# Patient Record
Sex: Male | Born: 1970 | Race: White | Hispanic: No | Marital: Married | Smoking: Never smoker
Health system: Southern US, Community
[De-identification: ages and names within clinical notes are randomized; demographics above are authoritative.]

## PROBLEM LIST (undated history)

## (undated) ENCOUNTER — Emergency Department (HOSPITAL_COMMUNITY): Admission: EM | Payer: Self-pay

## (undated) DIAGNOSIS — M2170 Unequal limb length (acquired), unspecified site: Secondary | ICD-10-CM

## (undated) DIAGNOSIS — F418 Other specified anxiety disorders: Secondary | ICD-10-CM

## (undated) DIAGNOSIS — D126 Benign neoplasm of colon, unspecified: Secondary | ICD-10-CM

## (undated) DIAGNOSIS — K219 Gastro-esophageal reflux disease without esophagitis: Secondary | ICD-10-CM

## (undated) DIAGNOSIS — I861 Scrotal varices: Secondary | ICD-10-CM

## (undated) DIAGNOSIS — M542 Cervicalgia: Secondary | ICD-10-CM

## (undated) DIAGNOSIS — M549 Dorsalgia, unspecified: Secondary | ICD-10-CM

## (undated) DIAGNOSIS — J209 Acute bronchitis, unspecified: Secondary | ICD-10-CM

## (undated) DIAGNOSIS — R0683 Snoring: Secondary | ICD-10-CM

## (undated) DIAGNOSIS — G4719 Other hypersomnia: Secondary | ICD-10-CM

## (undated) DIAGNOSIS — M5020 Other cervical disc displacement, unspecified cervical region: Secondary | ICD-10-CM

## (undated) DIAGNOSIS — D1803 Hemangioma of intra-abdominal structures: Secondary | ICD-10-CM

## (undated) DIAGNOSIS — J309 Allergic rhinitis, unspecified: Secondary | ICD-10-CM

## (undated) DIAGNOSIS — R202 Paresthesia of skin: Secondary | ICD-10-CM

## (undated) DIAGNOSIS — D734 Cyst of spleen: Secondary | ICD-10-CM

## (undated) HISTORY — PX: WISDOM TOOTH EXTRACTION: SHX21

---
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
## 1998-05-12 ENCOUNTER — Emergency Department (HOSPITAL_COMMUNITY): Admission: EM | Admit: 1998-05-12 | Discharge: 1998-05-12 | Payer: Self-pay | Admitting: Emergency Medicine

## 1998-05-12 ENCOUNTER — Encounter: Payer: Self-pay | Admitting: Emergency Medicine

## 1998-11-07 ENCOUNTER — Other Ambulatory Visit: Admission: RE | Admit: 1998-11-07 | Discharge: 1998-11-07 | Payer: Self-pay | Admitting: Gastroenterology

## 1998-12-19 ENCOUNTER — Emergency Department (HOSPITAL_COMMUNITY): Admission: EM | Admit: 1998-12-19 | Discharge: 1998-12-19 | Payer: Self-pay | Admitting: Emergency Medicine

## 1999-04-19 ENCOUNTER — Emergency Department (HOSPITAL_COMMUNITY): Admission: EM | Admit: 1999-04-19 | Discharge: 1999-04-19 | Payer: Self-pay | Admitting: Emergency Medicine

## 2001-11-01 ENCOUNTER — Encounter (INDEPENDENT_AMBULATORY_CARE_PROVIDER_SITE_OTHER): Payer: Self-pay | Admitting: Specialist

## 2001-11-01 ENCOUNTER — Ambulatory Visit (HOSPITAL_BASED_OUTPATIENT_CLINIC_OR_DEPARTMENT_OTHER): Admission: RE | Admit: 2001-11-01 | Discharge: 2001-11-01 | Payer: Self-pay | Admitting: Urology

## 2001-12-07 ENCOUNTER — Emergency Department (HOSPITAL_COMMUNITY): Admission: EM | Admit: 2001-12-07 | Discharge: 2001-12-08 | Payer: Self-pay | Admitting: Emergency Medicine

## 2001-12-08 ENCOUNTER — Encounter: Payer: Self-pay | Admitting: Emergency Medicine

## 2003-01-21 ENCOUNTER — Emergency Department (HOSPITAL_COMMUNITY): Admission: EM | Admit: 2003-01-21 | Discharge: 2003-01-21 | Payer: Self-pay | Admitting: Emergency Medicine

## 2003-06-21 ENCOUNTER — Emergency Department (HOSPITAL_COMMUNITY): Admission: EM | Admit: 2003-06-21 | Discharge: 2003-06-21 | Payer: Self-pay | Admitting: Emergency Medicine

## 2004-07-06 ENCOUNTER — Emergency Department (HOSPITAL_COMMUNITY): Admission: EM | Admit: 2004-07-06 | Discharge: 2004-07-07 | Payer: Self-pay | Admitting: Emergency Medicine

## 2007-07-30 ENCOUNTER — Emergency Department (HOSPITAL_COMMUNITY): Admission: EM | Admit: 2007-07-30 | Discharge: 2007-07-30 | Payer: Self-pay | Admitting: Emergency Medicine

## 2007-09-15 ENCOUNTER — Encounter: Admission: RE | Admit: 2007-09-15 | Discharge: 2007-09-15 | Payer: Self-pay | Admitting: Emergency Medicine

## 2007-12-14 ENCOUNTER — Encounter: Admission: RE | Admit: 2007-12-14 | Discharge: 2007-12-14 | Payer: Self-pay | Admitting: Emergency Medicine

## 2008-01-07 ENCOUNTER — Emergency Department (HOSPITAL_COMMUNITY): Admission: EM | Admit: 2008-01-07 | Discharge: 2008-01-07 | Payer: Self-pay | Admitting: Emergency Medicine

## 2008-04-18 ENCOUNTER — Emergency Department (HOSPITAL_COMMUNITY): Admission: EM | Admit: 2008-04-18 | Discharge: 2008-04-18 | Payer: Self-pay | Admitting: Emergency Medicine

## 2008-06-21 ENCOUNTER — Encounter: Admission: RE | Admit: 2008-06-21 | Discharge: 2008-06-21 | Payer: Self-pay | Admitting: Emergency Medicine

## 2008-07-30 ENCOUNTER — Emergency Department (HOSPITAL_COMMUNITY): Admission: EM | Admit: 2008-07-30 | Discharge: 2008-07-30 | Payer: Self-pay | Admitting: Family Medicine

## 2009-12-11 ENCOUNTER — Encounter: Payer: Self-pay | Admitting: Internal Medicine

## 2009-12-11 ENCOUNTER — Emergency Department (HOSPITAL_COMMUNITY): Admission: EM | Admit: 2009-12-11 | Discharge: 2009-12-11 | Payer: Self-pay | Admitting: *Deleted

## 2009-12-21 ENCOUNTER — Ambulatory Visit: Payer: Self-pay | Admitting: Pulmonary Disease

## 2009-12-21 ENCOUNTER — Ambulatory Visit: Payer: Self-pay | Admitting: Internal Medicine

## 2009-12-21 DIAGNOSIS — R0602 Shortness of breath: Secondary | ICD-10-CM | POA: Insufficient documentation

## 2009-12-21 DIAGNOSIS — Z9189 Other specified personal risk factors, not elsewhere classified: Secondary | ICD-10-CM | POA: Insufficient documentation

## 2009-12-21 DIAGNOSIS — J209 Acute bronchitis, unspecified: Secondary | ICD-10-CM

## 2009-12-21 HISTORY — DX: Acute bronchitis, unspecified: J20.9

## 2009-12-25 DIAGNOSIS — J309 Allergic rhinitis, unspecified: Secondary | ICD-10-CM | POA: Insufficient documentation

## 2009-12-25 HISTORY — DX: Allergic rhinitis, unspecified: J30.9

## 2010-01-15 ENCOUNTER — Ambulatory Visit: Payer: Self-pay | Admitting: Internal Medicine

## 2010-01-22 ENCOUNTER — Ambulatory Visit: Payer: Self-pay | Admitting: Internal Medicine

## 2010-04-18 DIAGNOSIS — D734 Cyst of spleen: Secondary | ICD-10-CM

## 2010-04-18 DIAGNOSIS — D126 Benign neoplasm of colon, unspecified: Secondary | ICD-10-CM | POA: Insufficient documentation

## 2010-04-18 DIAGNOSIS — I517 Cardiomegaly: Secondary | ICD-10-CM | POA: Insufficient documentation

## 2010-04-18 DIAGNOSIS — D1803 Hemangioma of intra-abdominal structures: Secondary | ICD-10-CM

## 2010-04-18 HISTORY — DX: Benign neoplasm of colon, unspecified: D12.6

## 2010-04-18 HISTORY — DX: Hemangioma of intra-abdominal structures: D18.03

## 2010-04-18 HISTORY — DX: Cyst of spleen: D73.4

## 2010-04-26 ENCOUNTER — Encounter: Admission: RE | Admit: 2010-04-26 | Discharge: 2010-04-26 | Payer: Self-pay | Admitting: Family Medicine

## 2010-07-14 ENCOUNTER — Encounter: Payer: Self-pay | Admitting: Emergency Medicine

## 2010-07-23 NOTE — Assessment & Plan Note (Signed)
Summary: f/u 1 month///kp   Primary Provider/Referring Provider:  Shary Decamp  CC:  1 month follow up visit-denies chest wall pain today and SOB.Marland Kitchen  History of Present Illness: December 21, 2009- 38 yoM never smoker self referred for chest discomfort and wheezing dyspnea . He went to ER 12/11/09 for chest pain, wheeze and shortness of breath. Pain under left niipple, seems related to emotional excitement/ upset- makes it hard to take a deep breath. Discomfort lasted 6 hours, then faded with some residual off and on the next day. With viral colds he gets bronchitis with sinus congestion and wheeze. Dypnea after meals while sitting. This will come and go vauely over minutes to an hour or so. CXR 12/11/09- Normal, NAD. Pneumonia- outpatient 2006. Seasonal rhinitis, worse as a child when he was on allergy vaccine. Milder nasal symptoms in recent years. Denies weight loss, fever, nodes, blood, palpitation, reflux or swelling.  January 22, 2010- Dyspnea, allergic rhinitis, chest wall pain No more chest wall pain. Sometimes feels urge to breathe harder, after meals or sometimes while sitting quietly. He occasionally wakes gasping, maybe a couple of times per week. He may need to sit up and get a sip of water. Not aware of reflux... Wife has told him he snores but not that he stops breathing. Keeps sense of wet throat. CXR was benign. He notices some costal assymetry, within normal limits, left lower anterior chest hr thinks may protrude just a ittle. PFT-WNL flows, volumes and DLCO, no response to dilator. FEV1/FVC 0.85.   Preventive Screening-Counseling & Management  Alcohol-Tobacco     Smoking Status: never  Current Medications (verified): 1)  None  Allergies (verified): 1)  ! Cipro 2)  ! 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 Passive smoke exposure- parents while he grew up. No ETOH  Risk Factors: Smoking Status: never (01/22/2010)  Review of Systems      See HPI       The patient complains of shortness of breath at rest.  The patient denies shortness of breath with activity, productive cough, non-productive cough, coughing up blood, chest pain, irregular heartbeats, acid heartburn, indigestion, loss of appetite, weight change, abdominal pain, difficulty swallowing, sore throat, tooth/dental problems, headaches, nasal congestion/difficulty breathing through nose, and sneezing.    Vital Signs:  Patient profile:   40 year old male Height:      69 inches Weight:      186 pounds BMI:     27.57 O2 Sat:      95 % on Room air Pulse rate:   88 / minute BP sitting:   122 / 82  (right arm) Cuff size:   regular  Vitals Entered By: Reynaldo Minium CMA (January 22, 2010 9:47 AM)  O2 Flow:  Room air CC: 1 month follow up visit-denies chest wall pain today and SOB.   Physical Exam  Additional Exam:  General: A/Ox3; pleasant and cooperative, NAD, wdwn SKIN: no rash, lesions NODES: no lymphadenopathy HEENT: [place]/AT, EOM- WNL, Conjuctivae- clear, PERRLA, TM-WNL, Nose- clear, Throat- clear and wnl, tonsils moderate, Mallampati  II, not red NECK: Supple w/ fair ROM, JVD- none,  normal carotid impulses w/o bruits Thyroid- normal to palpation, no stridor or hoarseness CHEST: Clear to P&A, tender to light touch L anterior ribs, which he says are "swollen"- not apparent to me. i do not appreciate much chest wall assymmetry today. No tenerness, rub or crepitus. HEART: RRR, no m/g/r heard ABDOMEN: Soft and nl; nml  bowel sounds; no organomegaly or masses noted KZS:WFUX, nl pulses, no edema, cyanosis or clubbing NEURO: Grossly intact to observation      Impression & Recommendations:  Problem # 1:  Hx of DYSPNEA (ICD-786.05)  He describes waking in sleep feeling choked. He doesn't recognize reflux, but that is the likely explanation, rather than nocturnal asthma or sleep apnea. I discussed acid blockers, elevation of head of bed, wait 2 hours after eating before lying down.  He is not descirbing wheeze or exertional dyspnea PFT is well within normal limitis.   Problem # 2:  CHEST WALL PAIN, HX OF (ICD-V15.89) I can't apprciate objective findings, but invited him to report if he had ongoing concern. Consider bone scan and/ or CT if needed.  Other Orders: Est. Patient Level IV (32355)   Patient Instructions: 1)  Please schedule a follow-up appointment as needed. 2)  Try minimizing reflux at night to see if that reduces the number of times when you wake feeling choked. 3)  Avoid lying down for at least 2 hours after eating or drinking 4)  Try taking an acid blocker for a month or so- an otc med like omeprazole, pepcid or prevacie should work well. Take this before a meal, once daily. 5)  Try elevating the head of your bedframe about the height of a brick, so that tilt tends to keep stomach juice downhill.

## 2010-07-23 NOTE — Miscellaneous (Signed)
Summary: Orders Update pft charges  Clinical Lists Changes  Orders: Added new Service order of Carbon Monoxide diffusing w/capacity (94720) - Signed Added new Service order of Lung Volumes (94240) - Signed Added new Service order of Spirometry (Pre & Post) (94060) - Signed 

## 2010-07-23 NOTE — Assessment & Plan Note (Signed)
Summary: CHEST CONGESTION -SELF REFERRAL //kp   Primary Provider/Referring Provider:  Shary Decamp  CC:  Pulmonary New pt-SOB, wheezing, and chest pain(left nipple area) x 6 months.  History of Present Illness: December 21, 2009- 38 yoM never smoker self referred for chest discomfort and wheezing dyspnea . He went to ER 12/11/09 for chest pain, wheeze and shortness of breath. Pain under left niipple, seems related to emotional excitement/ upset- makes it hard to take a deep breath. Discomfort lasted 6 hours, then faded with some residual off and on the next day. With viral colds he gets bronchitis with sinus congestion and wheeze. Dypnea after meals while sitting. This will come and go vauely over minutes to an hour or so. CXR 12/11/09- Normal, NAD. Pneumonia- outpatient 2006. Seasonal rhinitis, worse as a child when he waso n allergy vaccine. Milder nasal symptoms in recent years. Denies weight loss, fever, nodes, blood, palpitation, reflux or swelling.  Preventive Screening-Counseling & Management  Alcohol-Tobacco     Smoking Status: never  Current Medications (verified): 1)  None  Allergies (verified): 1)  ! Cipro 2)  ! 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 Passive smoke exposure- parents while he grew up. No ETOHSmoking Status:  never  Review of Systems       The patient complains of shortness of breath with activity, shortness of breath at rest, and chest pain.  The patient denies productive cough, non-productive cough, coughing up blood, irregular heartbeats, acid heartburn, indigestion, loss of  appetite, weight change, abdominal pain, difficulty swallowing, sore throat, tooth/dental problems, headaches, nasal congestion/difficulty breathing through nose, sneezing, itching, ear ache, anxiety, depression, hand/feet swelling, joint stiffness or pain, rash, change in color of mucus, and fever.    Vital Signs:  Patient profile:   40 year old male Height:      69 inches Weight:      183 pounds BMI:     27.12 O2 Sat:      97 % on Room air Pulse rate:   85 / minute BP sitting:   116 / 78  (left arm) Cuff size:   regular  Vitals Entered By: Reynaldo Minium CMA (December 21, 2009 10:36 AM)  O2 Flow:  Room air CC: Pulmonary New pt-SOB,wheezing,chest pain(left nipple area) x 6 months   Physical Exam  Additional Exam:  General: A/Ox3; pleasant and cooperative, NAD, wdwn SKIN: no rash, lesions NODES: no lymphadenopathy HEENT: [place]/AT, EOM- WNL, Conjuctivae- clear, PERRLA, TM-WNL, Nose- clear, Throat- clear and wnl, tonsils moderate, red NECK: Supple w/ fair ROM, JVD- none, normal carotid impulses w/o bruits Thyroid- normal to palpation, no stridor or hoarseness CHEST: Clear to P&A, tender to light touch L anterior ribs, which he says are "swollen"- not apparent to me. HEART: RRR, no m/g/r heard ABDOMEN: Soft and nl; nml bowel sounds; no organomegaly or masses noted WUX:LKGM, nl pulses, no edema, cyanosis or clubbing NEURO: Grossly intact to observation      Impression & Recommendations:  Problem # 1:  Hx of DYSPNEA (ICD-786.05)  Relation especially to meals suggests suggests mechanical crowding of his lungs by distended stomach. He describes waking occasionally feeling unable to take a breath, without cough or choke. Wife reports some snore. Consider  NPSG. We will want to plam PFT.  Problem # 2:  Hx of ACUTE BRONCHITIS (ICD-466.0)  Pattern seems related to virus URI to create the acute illness in June. It has largely faded now.  Problem # 3:  CHEST WALL PAIN, HX OF  (ICD-V15.89)  Probably musculoskeletal. We can look with rib detail x ray.  Other Orders: New Patient Level IV (08657) T-Ribs Unilateral 2 Views (71100TC) Radiology Referral (Radiology)  Patient Instructions: 1)  Please schedule a follow-up appointment in 1 month. 2)  See [facility] to schedule rib xray 3)  Schedule PFT 4)  Watch for possibility you have been having more acid reflux than you had realized.

## 2010-08-05 ENCOUNTER — Other Ambulatory Visit: Payer: Self-pay

## 2010-08-05 DIAGNOSIS — R221 Localized swelling, mass and lump, neck: Secondary | ICD-10-CM

## 2010-08-08 ENCOUNTER — Other Ambulatory Visit: Payer: Self-pay

## 2010-09-08 LAB — DIFFERENTIAL
Basophils Relative: 0 % (ref 0–1)
Eosinophils Relative: 1 % (ref 0–5)
Lymphocytes Relative: 26 % (ref 12–46)
Lymphs Abs: 1.5 10*3/uL (ref 0.7–4.0)
Neutro Abs: 3.9 10*3/uL (ref 1.7–7.7)
Neutrophils Relative %: 66 % (ref 43–77)

## 2010-09-08 LAB — POCT CARDIAC MARKERS

## 2010-09-08 LAB — D-DIMER, QUANTITATIVE: D-Dimer, Quant: <0.22 ug/mL-FEU (ref 0.00–0.48)

## 2010-09-08 LAB — CBC
Hemoglobin: 14.4 g/dL (ref 13.0–17.0)
MCHC: 34.2 g/dL (ref 30.0–36.0)
RBC: 4.84 MIL/uL (ref 4.22–5.81)
RDW: 13.5 % (ref 11.5–15.5)

## 2010-09-08 LAB — POCT I-STAT, CHEM 8
Chloride: 101 mEq/L (ref 96–112)
HCT: 45 % (ref 39.0–52.0)
Hemoglobin: 15.3 g/dL (ref 13.0–17.0)
Potassium: 4.5 mEq/L (ref 3.5–5.1)
Sodium: 138 mEq/L (ref 135–145)
TCO2: 29 mmol/L (ref 0–100)

## 2010-09-27 ENCOUNTER — Other Ambulatory Visit: Payer: Self-pay

## 2010-10-08 LAB — POCT URINALYSIS DIP (DEVICE)
Hgb urine dipstick: NEGATIVE
pH: 7 (ref 5.0–8.0)

## 2010-11-08 NOTE — Op Note (Signed)
[facility]  Patient:    DONTREL, SMETHERS Visit Number: [id number] MRN: [id number]          Service Type: NES Location: [facility] Attending Physician:  Trisha Mangle Dictated by:   Veverly Fells Vernie Ammons, M.D. Proc. Date: 11/01/01 Admit Date:  11/01/2001                             Operative Report  PREOPERATIVE DIAGNOSIS:  Hemospermia.  POSTOPERATIVE DIAGNOSIS:  Hemospermia.  PROCEDURE:  Cystoscopy and bilateral seminal vesiculogram.  SURGEON:  Mark C. Vernie Ammons, M.D.  ANESTHESIA:  General LMA.  DRAINS:  None.  SPECIMENS:  Left seminal vesicle fluid for cytology and culture.  ESTIMATED BLOOD LOSS:  Approximately 1 cc.  COMPLICATIONS:  None.  INDICATIONS:  The patient is a 40 year old white male, who has had hemospermia for some time.  He is extremely concerned about this fact and has been completely worked up with evaluation of his expressed prostatic secretions which were clear at the time of initial evaluation.  He was having some painful ejaculations as well, thought he had some prostatitis.  I put him on some empiric antibiotics with Levaquin, and he was better but continued to have some hemospermia.  KUB was negative, and free and total testosterone was normal.  Transrectal ultrasound revealed a normal-appearing prostate.  An MRI scan of the pelvis revealed left seminal vesicle had some proteinaceous fluid. Both seminal vesicles appeared mildly enlarged but otherwise unremarkable.  He has been on several courses of antibiotics without improvement, and he is brought to the OR today for further evaluation.  DESCRIPTION OF OPERATION:  After informed consent, the patient brought to the major OR, placed on the table, and administered general LMA anesthesia then moved to the dorsal lithotomy position.  His genitalia was sterilely prepped and draped.  The 22 French cystoscope was then introduced per urethra, and the entire urethra was noted to be  normal down to the sphincter which appears intact.  The prostatic urethra revealed no lesions.  The verumontanum was entirely normal, and no evidence of posterior urethral valves or other congenital abnormalities.  The bladder itself was fully inspected and noted to be free of any tumor, stones, or inflammatory lesions.  I filled the bladder to capacity of 550 cc.  When I drained it, he had a lot of cracking of the mucosa and petechial hemorrhages as well as glomerulation on the right lateral walls and floor of the bladder bilaterally.  The terminal effluent also was slightly bloody.  I then drained the bladder and removed the cystoscope.  The transrectal ultrasound probe was then used to perform transrectal ultrasound and with real-time ultrasound guidance, I had passed an 18 gauge needle through the guide into the left seminal vesicle.  Seminal vesicle fluid was then easily obtained, and this was sent for the above-noted studies. I then injected contrast that had 80 mg of gentamicin mixed in, into the seminal vesicle and noted it appeared to be normal without any significant filling defects.  A right seminal vesiculogram was then performed in identical fashion with no abnormal findings identified.  I then removed the transrectal ultrasound probe, rechecked the rectum and noted no significant bleeding, and the patient was awakened and taken to recovery room in stable and satisfactory condition.  I am going to give the patient a prescription for #24 Vicodin ES and then also start him on some Proscar.  I am  not sure that the findings in his bladder are of any significance, although they could be findings consistent with early IC. I do not think he has enough of the other symptoms to warrant this diagnosis at this time.  His follow-up will be in my office in approximately one month, sooner if he has any further difficulty.  He will also continue on antibiotics for the next three days after  the procedure. Dictated by:   Veverly Fells Vernie Ammons, M.D. Attending Physician:  Trisha Mangle DD:  11/01/01 TD:  11/01/01 Job: [id number]

## 2011-03-14 LAB — DIFFERENTIAL
Basophils Absolute: 0
Basophils Relative: 0
Eosinophils Relative: 0
Lymphocytes Relative: 12
Monocytes Relative: 4
Neutro Abs: 7.8 — ABNORMAL HIGH

## 2011-03-14 LAB — COMPREHENSIVE METABOLIC PANEL
ALT: 21
AST: 17
Albumin: 4.2
BUN: 7
Creatinine, Ser: 0.82
GFR calc Af Amer: >60
Potassium: 3.6
Sodium: 136

## 2011-03-14 LAB — CBC
MCHC: 34.1
Platelets: 242
RDW: 13.4
WBC: 9.3

## 2011-03-24 LAB — POCT CARDIAC MARKERS: Troponin i, poc: <0.05

## 2011-03-24 LAB — CBC
Hemoglobin: 14.3
RBC: 4.89
RDW: 13.5
WBC: 8.9

## 2011-03-24 LAB — DIFFERENTIAL
Lymphocytes Relative: 18
Lymphs Abs: 1.6
Monocytes Absolute: 0.6
Monocytes Relative: 7
Neutro Abs: 6.7

## 2011-03-24 LAB — BASIC METABOLIC PANEL
CO2: 30
Calcium: 9.2
GFR calc Af Amer: >60
GFR calc non Af Amer: >60
Glucose, Bld: 94
Sodium: 136

## 2012-08-17 DIAGNOSIS — K219 Gastro-esophageal reflux disease without esophagitis: Secondary | ICD-10-CM

## 2012-08-17 HISTORY — DX: Gastro-esophageal reflux disease without esophagitis: K21.9

## 2012-11-08 ENCOUNTER — Ambulatory Visit: Payer: Self-pay | Admitting: Cardiovascular Disease

## 2012-12-15 DIAGNOSIS — Z8249 Family history of ischemic heart disease and other diseases of the circulatory system: Secondary | ICD-10-CM | POA: Insufficient documentation

## 2013-04-19 DIAGNOSIS — I861 Scrotal varices: Secondary | ICD-10-CM | POA: Insufficient documentation

## 2013-04-19 HISTORY — DX: Scrotal varices: I86.1

## 2014-03-28 ENCOUNTER — Other Ambulatory Visit: Payer: Self-pay | Admitting: Dermatology

## 2017-01-18 ENCOUNTER — Emergency Department (HOSPITAL_COMMUNITY): Payer: BLUE CROSS/BLUE SHIELD

## 2017-01-18 ENCOUNTER — Emergency Department (HOSPITAL_COMMUNITY)
Admission: EM | Admit: 2017-01-18 | Discharge: 2017-01-18 | Disposition: A | Discharge status: Home / Self Care | Payer: BLUE CROSS/BLUE SHIELD | Attending: Emergency Medicine | Admitting: Emergency Medicine

## 2017-01-18 DIAGNOSIS — M546 Pain in thoracic spine: Secondary | ICD-10-CM | POA: Insufficient documentation

## 2017-01-18 DIAGNOSIS — R52 Pain, unspecified: Secondary | ICD-10-CM

## 2017-01-18 MED ORDER — TRAMADOL HCL 50 MG PO TABS: 25.0000 mg | ORAL_TABLET | Freq: Four times a day (QID) | ORAL | 0 refills | Status: DC | PRN

## 2017-01-18 MED ORDER — ACETAMINOPHEN 325 MG PO TABS
650.0000 mg | ORAL_TABLET | Freq: Once | ORAL | Status: AC
  Administered 2017-01-18: 650 mg via ORAL
  Filled 2017-01-18: qty 2

## 2017-01-18 MED ORDER — PREDNISONE 10 MG (21) PO TBPK: ORAL_TABLET | Freq: Every day | ORAL | 0 refills | Status: DC

## 2017-01-18 MED ORDER — NAPROXEN 250 MG PO TABS
500.0000 mg | ORAL_TABLET | Freq: Once | ORAL | Status: AC
  Administered 2017-01-18: 500 mg via ORAL
  Filled 2017-01-18: qty 2

## 2017-01-18 MED ORDER — FAMOTIDINE 20 MG PO TABS
20.0000 mg | ORAL_TABLET | Freq: Once | ORAL | Status: AC
  Administered 2017-01-18: 20 mg via ORAL
  Filled 2017-01-18: qty 1

## 2017-01-18 MED ORDER — LIDOCAINE 5 % EX PTCH: 1.0000 | MEDICATED_PATCH | CUTANEOUS | 0 refills | Status: DC

## 2017-01-18 MED ORDER — MELOXICAM 15 MG PO TABS: 15.0000 mg | ORAL_TABLET | Freq: Every day | ORAL | 0 refills | Status: DC

## 2017-01-18 NOTE — ED Provider Notes (Signed)
[facility] DEPT Provider Note   CSN: [id number] Arrival date & time: 01/18/17  0749     History   Chief Complaint Chief Complaint  Patient presents with  . Back Pain    HPI Evan Henry is a 46 y.o. male who presents emergency by with chief complaint of back pain. 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 At last at about 2 days. Due to the strenuous nature of the job. Patient states that he was expected to pull 5 gallon water jugs from a height over his head down to the ground and deliver them. He strained his middle back and shortly thereafter (job. He states that over the first 2 days he felt like he had muscle strain, used ice, heat and massage. The pain seemed to improve. However, he developed pain radiating on the left around the left rib cage. He denies any shortness of breath, cough or hemoptysis. He denies rashes or viral prodrome symptoms. The patient states that yesterday the pain became more intense, at times extremely sharp. He's had been having difficulty falling asleep. The pain is always on the left. He has discomfort when lying on either side in the bed and has had to get up to find different positions to be comfortable. This morning the patient states that he was lying over a bunch of pillows to stretch his back. He fell asleep for about 10 minutes and when he woke up both of his legs were completely numb and he is unable to move him from the knees down. Patient states that this made him very uncomfortable. He sat on the side of the bed and tried to massage his legs to make the blood returned to them. He was unable to move his ankles or feet. He tried to stand and was unable to ambulate. Patient sat back down and became nervous. He called his wife. At that time, he began to feel a prodrome of presyncope with tunnel vision, hearing loss, difficulty getting his words out, lightheadedness, weakness and diaphoresis. His wife called 5. This lasted only a  couple seconds. He laid down and the symptoms went away. His legs began to have paresthesias and sensation returned. Patient is somewhat concerned because he has a history of a brain cyst and is followed by Dr. Arnoldo Morale in neurosurgery. He denies any current symptoms except for aching around the left rib cage. He has not used any anti-inflammatory or pain medications at home as he has a history of gastritis with NSAID use.  HPI  No past medical history on file.  Patient Active Problem List   Diagnosis Date Noted  . ALLERGIC RHINITIS 12/25/2009  . ACUTE BRONCHITIS 12/21/2009  . DYSPNEA 12/21/2009  . CHEST WALL PAIN, HX OF 12/21/2009    No past surgical history on file.     Home Medications    Prior to Admission medications   Medication Sig Start Date End Date Taking? Authorizing Provider  lidocaine (LIDODERM) 5 % Place 1 patch onto the skin daily. Remove & Discard patch within 12 hours or as directed by MD 01/18/17   Margarita Mail, PA-C  meloxicam (MOBIC) 15 MG tablet Take 1 tablet (15 mg total) by mouth daily. 01/18/17   Cassidee Deats, Vernie Shanks, PA-C  predniSONE (STERAPRED UNI-PAK 21 TAB) 10 MG (21) TBPK tablet Take by mouth daily. Take 6 tabs by mouth daily  for 2 days, then 5 tabs for 2 days, then 4 tabs for 2 days, then 3  tabs for 2 days, 2 tabs for 2 days, then 1 tab by mouth daily for 2 days 01/18/17   Margarita Mail, PA-C  traMADol (ULTRAM) 50 MG tablet Take 0.5-1 tablets (25-50 mg total) by mouth every 6 (six) hours as needed. 01/18/17   Margarita Mail, PA-C    Family History No family history on file.  Social History Social History  Substance Use Topics  . Smoking status: Not on file  . Smokeless tobacco: Not on file  . Alcohol use Not on file     Allergies   Ciprofloxacin; Levofloxacin; and Sulfa antibiotics   Review of Systems Review of Systems Ten systems reviewed and are negative for acute change, except as noted in the HPI.    Physical Exam Updated Vital  Signs BP 104/69   Pulse 68   Resp 16   SpO2 98%   Physical Exam  Constitutional: He appears well-developed and well-nourished. No distress.  HENT:  Head: Normocephalic and atraumatic.  Eyes: Conjunctivae are normal. No scleral icterus.  Neck: Normal range of motion. Neck supple.  Cardiovascular: Normal rate, regular rhythm and normal heart sounds.   Pulmonary/Chest: Effort normal and breath sounds normal. No respiratory distress. He exhibits no tenderness.  Abdominal: Soft. There is no tenderness.  Musculoskeletal: He exhibits no edema.  Full range of motion of the spine, no reproducible pain with palpation of the rib cage or chest wall.   Neurological: He is alert.  Skin: Skin is warm and dry. He is not diaphoretic.  Psychiatric: His behavior is normal.  Nursing note and vitals reviewed.    ED Treatments / Results  Labs (all labs ordered are listed, but only abnormal results are displayed) Labs Reviewed - No data to display  EKG  EKG Interpretation None       Radiology No results found.  Procedures Procedures (including critical care time)  Medications Ordered in ED Medications  acetaminophen (TYLENOL) tablet 650 mg (650 mg Oral Given 01/18/17 0917)  naproxen (NAPROSYN) tablet 500 mg (500 mg Oral Given 01/18/17 0917)  famotidine (PEPCID) tablet 20 mg (20 mg Oral Given 01/18/17 0917)     Initial Impression / Assessment and Plan / ED Course  I have reviewed the triage vital signs and the nursing notes.  Pertinent labs & imaging results that were available during my care of the patient were reviewed by me and considered in my medical decision making (see chart for details).     Patient with thoracic back pain.I suspect slipped thoracic disk. No evidence of herpes zoster.  Patient's pain is improved with non-narcotic treatment.  I have very low suspicion for PE or other intrathoracic/ pulmonary process. Patient has established relationship with neurosurgery.  Follow up with neurosurgery.   Final Clinical Impressions(s) / ED Diagnoses   Final diagnoses:  Acute left-sided thoracic back pain    New Prescriptions Discharge Medication List as of 01/18/2017 10:50 AM    START taking these medications   Details  lidocaine (LIDODERM) 5 % Place 1 patch onto the skin daily. Remove & Discard patch within 12 hours or as directed by MD, Starting Sun 01/18/2017, Print    meloxicam (MOBIC) 15 MG tablet Take 1 tablet (15 mg total) by mouth daily., Starting Sun 01/18/2017, Print    predniSONE (STERAPRED UNI-PAK 21 TAB) 10 MG (21) TBPK tablet Take by mouth daily. Take 6 tabs by mouth daily  for 2 days, then 5 tabs for 2 days, then 4 tabs for 2 days, then 3  tabs for 2 days, 2 tabs for 2 days, then 1 tab by mouth daily for 2 days, Starting Sun 01/18/2017, Print    traMADol (ULTRAM) 50 MG tablet Take 0.5-1 tablets (25-50 mg total) by mouth every 6 (six) hours as needed., Starting Sun 01/18/2017, Print         Margarita Mail, PA-C 01/22/17 [place], [place], DO 01/22/17 2037

## 2017-01-18 NOTE — Discharge Instructions (Signed)
You have been diagnosed by your caregiver as having chest wall pain. SEEK IMMEDIATE MEDICAL ATTENTION IF: You develop a fever.  Your chest pains become severe or intolerable.  You develop new, unexplained symptoms (problems).  You develop shortness of breath, nausea, vomiting, sweating or feel light headed.  You develop a new cough or you cough up blood.  SEEK IMMEDIATE MEDICAL ATTENTION IF: New numbness, tingling, weakness, or problem with the use of your arms or legs.  Severe back pain not relieved with medications.  Change in bowel or bladder control.  Increasing pain in any areas of the body (such as chest or abdominal pain).  Shortness of breath, dizziness or fainting.  Nausea (feeling sick to your stomach), vomiting, fever, or sweats.

## 2017-01-18 NOTE — ED Triage Notes (Signed)
Pt arrives via [facility] from home for c/o back pain x2 weeks after lifting something heavy at work. Ems reports pt had been lying face down for pain relief and then noticed that he had some numbness in bilateral legs from his knees down. Pt was able to ambulate to truck with ems. Pt a/ox4, resp e/u, nad.

## 2017-01-22 ENCOUNTER — Other Ambulatory Visit (HOSPITAL_COMMUNITY): Payer: Self-pay | Admitting: Pulmonary Disease

## 2017-01-22 DIAGNOSIS — M546 Pain in thoracic spine: Secondary | ICD-10-CM

## 2017-01-28 ENCOUNTER — Ambulatory Visit (HOSPITAL_COMMUNITY): Payer: BLUE CROSS/BLUE SHIELD

## 2017-01-29 ENCOUNTER — Other Ambulatory Visit (HOSPITAL_BASED_OUTPATIENT_CLINIC_OR_DEPARTMENT_OTHER): Payer: Self-pay | Admitting: Pulmonary Disease

## 2017-01-29 DIAGNOSIS — M546 Pain in thoracic spine: Secondary | ICD-10-CM

## 2017-01-31 ENCOUNTER — Ambulatory Visit (HOSPITAL_BASED_OUTPATIENT_CLINIC_OR_DEPARTMENT_OTHER)
Admission: RE | Admit: 2017-01-31 | Discharge: 2017-01-31 | Disposition: A | Discharge status: Home / Self Care | Payer: BLUE CROSS/BLUE SHIELD | Source: Ambulatory Visit | Attending: Pulmonary Disease | Admitting: Pulmonary Disease

## 2017-01-31 DIAGNOSIS — M546 Pain in thoracic spine: Secondary | ICD-10-CM

## 2017-01-31 DIAGNOSIS — M5124 Other intervertebral disc displacement, thoracic region: Secondary | ICD-10-CM | POA: Diagnosis not present

## 2017-02-09 ENCOUNTER — Ambulatory Visit (HOSPITAL_COMMUNITY): Payer: BLUE CROSS/BLUE SHIELD

## 2017-05-28 ENCOUNTER — Ambulatory Visit: Payer: Self-pay | Admitting: Podiatry

## 2017-07-28 ENCOUNTER — Ambulatory Visit (INDEPENDENT_AMBULATORY_CARE_PROVIDER_SITE_OTHER): Payer: BLUE CROSS/BLUE SHIELD

## 2017-07-28 ENCOUNTER — Encounter: Payer: Self-pay | Admitting: Podiatry

## 2017-07-28 ENCOUNTER — Ambulatory Visit: Payer: BLUE CROSS/BLUE SHIELD | Admitting: Podiatry

## 2017-07-28 ENCOUNTER — Other Ambulatory Visit: Payer: Self-pay | Admitting: Podiatry

## 2017-07-28 VITALS — BP 134/85 | HR 77 | Resp 16

## 2017-07-28 DIAGNOSIS — M722 Plantar fascial fibromatosis: Secondary | ICD-10-CM

## 2017-07-28 DIAGNOSIS — M5020 Other cervical disc displacement, unspecified cervical region: Secondary | ICD-10-CM | POA: Insufficient documentation

## 2017-07-28 DIAGNOSIS — M2170 Unequal limb length (acquired), unspecified site: Secondary | ICD-10-CM

## 2017-07-28 HISTORY — DX: Other cervical disc displacement, unspecified cervical region: M50.20

## 2017-07-28 HISTORY — DX: Unequal limb length (acquired), unspecified site: M21.70

## 2017-07-28 MED ORDER — METHYLPREDNISOLONE 4 MG PO TBPK: ORAL_TABLET | ORAL | 0 refills | Status: DC

## 2017-07-28 NOTE — Patient Instructions (Signed)

## 2017-07-28 NOTE — Progress Notes (Signed)
  Subjective:  Patient ID: MILBURN FREENEY, male    DOB: 1970/09/04,  MRN: [id number] HPI Chief Complaint  Patient presents with  . Foot Pain    Plantar foot left - patient states heel, arch and forefoot pain since October 2018, was at the fair and felt a sudden pain and no relief since, no AM pain, worse at end of day, history of PF right - Instride made an orthotic for that foot only, tried ice, massage, and cream - no help    47 y.o. male presents with the above complaint.     No past medical history on file.   Current Outpatient Medications:  .  HYDROcodone-acetaminophen (NORCO/VICODIN) 5-325 MG tablet, Take 1 tablet by mouth every 6 (six) hours as needed for moderate pain., Disp: , Rfl:   Allergies  Allergen Reactions  . Ciprofloxacin Other (See Comments)    Joint pain   . Levofloxacin Other (See Comments)    Joint pain   . Sulfa Antibiotics Nausea And Vomiting and Rash   Review of Systems  Musculoskeletal: Positive for back pain.  All other systems reviewed and are negative.  Objective:   Vitals:   07/28/17 1430  BP: 134/85  Pulse: 77  Resp: 16    General: Well developed, nourished, in no acute distress, alert and oriented x3   Dermatological: Skin is warm, dry and supple bilateral. Nails x 10 are well maintained; remaining integument appears unremarkable at this time. There are no open sores, no preulcerative lesions, no rash or signs of infection present.  Vascular: Dorsalis Pedis artery and Posterior Tibial artery pedal pulses are 2/4 bilateral with immedate capillary fill time. Pedal hair growth present. No varicosities and no lower extremity edema present bilateral.   Neruologic: Grossly intact via light touch bilateral. Vibratory intact via tuning fork bilateral. Protective threshold with Semmes Wienstein monofilament intact to all pedal sites bilateral. Patellar and Achilles deep tendon reflexes 2+ bilateral. No Babinski or clonus noted bilateral.    Musculoskeletal: No gross boney pedal deformities bilateral. No pain, crepitus, or limitation noted with foot and ankle range of motion bilateral. Muscular strength 5/5 in all groups tested bilateral.  Gait: Unassisted, Nonantalgic.    Radiographs:  3 views of the left foot were taken today demonstrating no acute process.  He does however have what appears to be an interruption of the plantar fascia with some surrounding soft tissue increase in density at the plantar fascial calcaneal insertion site.  It appears that there is possibly a rupture here as well as inflammatory response.  No fractures are identified.  Assessment & Plan:   Assessment: Central cord plantar fasciitis left foot.  Probable rupture of the medial cord of the plantar fascia.  Plan: Discussed etiology pathology conservative versus surgical therapies.  At this point I injected his left heel today with Kenalog and local anesthetic consisting of 20 mg of Kenalog 5 mg of Marcaine after sterile Betadine skin prep and verbal consent.  Tolerated procedure well without complications.  Placement of plantar fascial brace and dispensed a night splint.  Discussed appropriate shoe gear stretching exercises ice therapy as your modifications.  Because of his history of pancreatitis I only prescribed methylprednisolone.  Did not dispense a prescription for nonsteroidal.  I will follow-up with him in 1 month.     Max T. [place], [place]

## 2017-08-25 ENCOUNTER — Other Ambulatory Visit: Payer: Self-pay

## 2017-08-25 ENCOUNTER — Emergency Department (HOSPITAL_BASED_OUTPATIENT_CLINIC_OR_DEPARTMENT_OTHER)
Admission: EM | Admit: 2017-08-25 | Discharge: 2017-08-25 | Disposition: A | Discharge status: Home / Self Care | Payer: BLUE CROSS/BLUE SHIELD | Attending: Emergency Medicine | Admitting: Emergency Medicine

## 2017-08-25 ENCOUNTER — Emergency Department (HOSPITAL_BASED_OUTPATIENT_CLINIC_OR_DEPARTMENT_OTHER): Payer: BLUE CROSS/BLUE SHIELD

## 2017-08-25 ENCOUNTER — Encounter (HOSPITAL_BASED_OUTPATIENT_CLINIC_OR_DEPARTMENT_OTHER): Payer: Self-pay

## 2017-08-25 DIAGNOSIS — J40 Bronchitis, not specified as acute or chronic: Secondary | ICD-10-CM | POA: Diagnosis not present

## 2017-08-25 DIAGNOSIS — R05 Cough: Secondary | ICD-10-CM | POA: Diagnosis present

## 2017-08-25 DIAGNOSIS — R69 Illness, unspecified: Secondary | ICD-10-CM

## 2017-08-25 DIAGNOSIS — J111 Influenza due to unidentified influenza virus with other respiratory manifestations: Secondary | ICD-10-CM

## 2017-08-25 DIAGNOSIS — J069 Acute upper respiratory infection, unspecified: Secondary | ICD-10-CM

## 2017-08-25 HISTORY — DX: Cervicalgia: M54.2

## 2017-08-25 HISTORY — DX: Dorsalgia, unspecified: M54.9

## 2017-08-25 MED ORDER — ACETAMINOPHEN 500 MG PO TABS
1000.0000 mg | ORAL_TABLET | Freq: Once | ORAL | Status: AC
  Administered 2017-08-25: 1000 mg via ORAL
  Filled 2017-08-25: qty 2

## 2017-08-25 MED ORDER — IBUPROFEN 800 MG PO TABS: 800.0000 mg | ORAL_TABLET | Freq: Three times a day (TID) | ORAL | 0 refills | Status: DC

## 2017-08-25 MED ORDER — ACETAMINOPHEN 500 MG PO TABS: 1000.0000 mg | ORAL_TABLET | Freq: Four times a day (QID) | ORAL | 0 refills | Status: DC | PRN

## 2017-08-25 MED ORDER — HYDROCODONE-HOMATROPINE 5-1.5 MG/5ML PO SYRP: 5.0000 mL | ORAL_SOLUTION | Freq: Four times a day (QID) | ORAL | 0 refills | Status: DC | PRN

## 2017-08-25 NOTE — Discharge Instructions (Signed)
1.  Complete your course of amoxicillin as prescribed previously. 2.  You may also continue Mucinex. 3.  Take Hycodan syrup as needed for severe coughing. 4.  Take ibuprofen and acetaminophen for control of fever and body ache. 5.  See your family doctor for recheck in 3-7 days. 6.  Return to the emergency department if you are developing difficulty breathing, chest pain or worsening symptoms.

## 2017-08-25 NOTE — ED Triage Notes (Signed)
C/o flu like sx x 4 days-NAD-steady gait 

## 2017-08-25 NOTE — ED Provider Notes (Signed)
[facility] EMERGENCY DEPARTMENT Provider Note   CSN: [id number] Arrival date & time: 08/25/17  2021     History   Chief Complaint Chief Complaint  Patient presents with  . Cough    HPI Evan Henry is a 47 y.o. male.  HPI Patient reports that last week he went to her friend's house to help with a home improvement project.  Some dusty material came out of the light fixture that he ended up in healing.  He reports that about a day after that he felt like he had chest congestion and sinus pressure.  He had some concern that it might be due to that exposure.  On Monday he schedule an appointment with his PCP.  Patient does have history of frequent sinus infection and due to facial pressure, nasal congestion he was started on amoxicillin, Sudafed and guaifenesin.  Reports he started to get a lot more coughing and has now developed a fever that he is measured at home as well.  He does not feel like those medications of help.  No nausea vomiting or diarrhea. Past Medical History:  Diagnosis Date  . Back pain   . Neck pain     Patient Active Problem List   Diagnosis Date Noted  . Cervical herniated disc 07/28/2017  . Leg length discrepancy 07/28/2017  . Varicocele 04/19/2013  . Family history of premature CAD 12/15/2012  . GERD (gastroesophageal reflux disease) 08/17/2012  . 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 Hepatic hemangioma 04/18/2010  . 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 ALLERGIC RHINITIS 12/25/2009  . ACUTE BRONCHITIS 12/21/2009  . DYSPNEA 12/21/2009  . CHEST WALL PAIN, HX OF 12/21/2009    Past Surgical History:  Procedure Laterality Date  . WISDOM TOOTH EXTRACTION         Home Medications    Prior to Admission medications   Medication Sig Start Date End Date Taking? Authorizing Provider  acetaminophen (TYLENOL) 500 MG tablet Take 2 tablets (1,000 mg total) by mouth every 6 (six) hours as needed. 08/25/17    Charlesetta Shanks, MD  HYDROcodone-acetaminophen (NORCO/VICODIN) 5-325 MG tablet Take 1 tablet by mouth every 6 (six) hours as needed for moderate pain.    [provider]  HYDROcodone-homatropine (HYCODAN) 5-1.5 MG/5ML syrup Take 5 mLs by mouth every 6 (six) hours as needed for cough. 08/25/17   Charlesetta Shanks, MD  ibuprofen (ADVIL,MOTRIN) 800 MG tablet Take 1 tablet (800 mg total) by mouth 3 (three) times daily. 08/25/17   Charlesetta Shanks, MD    Family History No family history on file.  Social History Social History   Tobacco Use  . Smoking status: Never Smoker  . Smokeless tobacco: Never Used  Substance Use Topics  . Alcohol use: Yes    Comment: occ  . Drug use: No     Allergies   Ciprofloxacin; Levofloxacin; Meloxicam; and Sulfa antibiotics   Review of Systems Review of Systems 10 Systems reviewed and are negative for acute change except as noted in the HPI.  Physical Exam Updated Vital Signs BP (!) 172/97 (BP Location: Right Arm)   Pulse (!) 125   Temp 99 F (37.2 C) (Oral)   Resp 20   Ht 5\' 9"  (1.753 m)   Wt 80.7 kg (178 lb)   SpO2 98%   BMI 26.29 kg/m   Physical Exam  Constitutional: He is oriented to person, place, and time. He appears well-developed and well-nourished. No distress.  HENT:  Head: Normocephalic and atraumatic.  Right Ear: External ear normal.  Left Ear: External ear normal.  Nose: Nose normal.  Mouth/Throat: Oropharynx is clear and moist.  Bilateral TMs normal.  Patient does endorse percussion tenderness over the frontal sinuses and the maxillary sinuses.  He does not have facial redness or swelling.  Eyes: Conjunctivae and EOM are normal. Pupils are equal, round, and reactive to light.  Neck: Neck supple.  Cardiovascular: Normal rate, regular rhythm, normal heart sounds and intact distal pulses.  No murmur heard. Pulmonary/Chest: Effort normal and breath sounds normal. No respiratory distress.  Abdominal: Soft. There is no  tenderness.  Musculoskeletal: Normal range of motion. He exhibits no edema or tenderness.  No peripheral edema.  Skin condition very good.  Calves soft and nontender.  Neurological: He is alert and oriented to person, place, and time. No cranial nerve deficit. He exhibits normal muscle tone. Coordination normal.  Skin: Skin is warm and dry.  Psychiatric: He has a normal mood and affect.  Nursing note and vitals reviewed.    ED Treatments / Results  Labs (all labs ordered are listed, but only abnormal results are displayed) Labs Reviewed - No data to display  EKG  EKG Interpretation None       Radiology Dg Chest 2 View  Result Date: 08/25/2017 CLINICAL DATA:  Fever, chest pain and eye pain after remodeling home 3 days ago. Possible inhalation exposure. EXAM: CHEST - 2 VIEW COMPARISON:  Chest radiograph January 18, 2017 FINDINGS: Cardiomediastinal silhouette is normal. No pleural effusions or focal consolidations. Trachea projects midline and there is no pneumothorax. Soft tissue planes and included osseous structures are non-suspicious. IMPRESSION: Negative. Electronically Signed   By: Elon Alas M.D.   On: 08/25/2017 21:02    Procedures Procedures (including critical care time)  Medications Ordered in ED Medications - No data to display   Initial Impression / Assessment and Plan / ED Course  I have reviewed the triage vital signs and the nursing notes.  Pertinent labs & imaging results that were available during my care of the patient were reviewed by me and considered in my medical decision making (see chart for details).      Final Clinical Impressions(s) / ED Diagnoses   Final diagnoses:  Upper respiratory tract infection, unspecified type  Bronchitis  Influenza-like illness   Patient is clinically well appearance.  He has had constellation of upper respiratory symptoms and fever with progressive increase in coughing.  Sounds are clear.  Patient does not have  any respiratory distress.  Chest x-ray is clear without signs of secondary pneumonia.  Patient reports he did have influenza B several months ago.  At this time, consideration is for influenza illness as well.  Patient describes starting with upper respiratory symptoms that are progressed to coughing and now some fever.  Symptoms have been present for greater than 2 days.  At this time I will not opt to start Tamiflu.  Patient was started on amoxicillin by his primary care physician for facial pain and pressure with nasal discharge.  Patient does have percussion tenderness and describes fever.  I will have him go ahead and complete his amoxicillin course as prescribed.  For improved symptomatic control plan will be to have the patient take acetaminophen and Tylenol for pain and fever.  Hycodan syrup prescribed for cough.  Return precautions reviewed. ED Discharge Orders        Ordered    HYDROcodone-homatropine (HYCODAN) 5-1.5 MG/5ML syrup  Every 6 hours PRN     08/25/17 2330    acetaminophen (TYLENOL) 500 MG tablet  Every 6 hours PRN     08/25/17 2330    ibuprofen (ADVIL,MOTRIN) 800 MG tablet  3 times daily     08/25/17 2330       Charlesetta Shanks, MD 08/25/17 2338

## 2017-08-25 NOTE — ED Notes (Signed)
Pt reports helping his son with some construction to an old house on Saturday. Felt like he "inhaled dust or something". That night reported cough started with body aches and fevers. Saw PCP yesterday and got rx of amoxicillin 500mg . Pt reports he has had 2 doses along with mucinex. Pt reports symptoms have gotten worse.

## 2017-09-03 ENCOUNTER — Ambulatory Visit: Payer: BLUE CROSS/BLUE SHIELD | Admitting: Podiatry

## 2017-10-08 ENCOUNTER — Telehealth: Payer: Self-pay | Admitting: Neurology

## 2017-10-08 ENCOUNTER — Other Ambulatory Visit: Payer: Self-pay

## 2017-10-08 ENCOUNTER — Encounter: Payer: Self-pay | Admitting: Neurology

## 2017-10-08 ENCOUNTER — Ambulatory Visit: Payer: BLUE CROSS/BLUE SHIELD | Admitting: Neurology

## 2017-10-08 VITALS — BP 130/86 | HR 93 | Resp 16 | Ht 59.0 in | Wt 184.0 lb

## 2017-10-08 DIAGNOSIS — R413 Other amnesia: Secondary | ICD-10-CM | POA: Diagnosis not present

## 2017-10-08 DIAGNOSIS — M5020 Other cervical disc displacement, unspecified cervical region: Secondary | ICD-10-CM | POA: Diagnosis not present

## 2017-10-08 DIAGNOSIS — R0683 Snoring: Secondary | ICD-10-CM | POA: Diagnosis not present

## 2017-10-08 DIAGNOSIS — M4802 Spinal stenosis, cervical region: Secondary | ICD-10-CM | POA: Diagnosis not present

## 2017-10-08 DIAGNOSIS — G4719 Other hypersomnia: Secondary | ICD-10-CM

## 2017-10-08 DIAGNOSIS — F418 Other specified anxiety disorders: Secondary | ICD-10-CM

## 2017-10-08 DIAGNOSIS — I517 Cardiomegaly: Secondary | ICD-10-CM

## 2017-10-08 DIAGNOSIS — G930 Cerebral cysts: Secondary | ICD-10-CM

## 2017-10-08 HISTORY — DX: Snoring: R06.83

## 2017-10-08 HISTORY — DX: Other specified anxiety disorders: F41.8

## 2017-10-08 HISTORY — DX: Other hypersomnia: G47.19

## 2017-10-08 MED ORDER — DULOXETINE HCL 60 MG PO CPEP: 60.0000 mg | ORAL_CAPSULE | Freq: Every day | ORAL | 11 refills | Status: DC

## 2017-10-08 MED ORDER — GABAPENTIN 300 MG PO CAPS
ORAL_CAPSULE | ORAL | 11 refills | Status: DC
Start: 2017-10-08 — End: 2019-03-10

## 2017-10-08 NOTE — Telephone Encounter (Signed)
[employer] Auth: [id number] (exp. 10/08/17 to 11/06/17). Pt is scheduled at GI for 10/18/17.

## 2017-10-08 NOTE — Progress Notes (Signed)
[facility]  PATIENT: Evan Henry DOB: 11-16-1970  REFERRING DOCTOR OR PCP:  Sinda Du SOURCE: patient, notes from PCP, imaging reports and personal review of images on PACS.  _________________________________   HISTORICAL  CHIEF COMPLAINT:  Chief Complaint  Patient presents with  . Back Pain    Sts. he has had burning pian in mid back since trying to catch a 5 gallon water jug that was falling at work in July 2018. C/O ongoing spasms both legs, left worse than right.  Has been eval by Dr. Arnoldo Morale for same, was told surgery is not an option for him. Sts. PT made pain/spasms worse.Sts. restricting activity, rest, Hydrocodone (rx'd by pcp), changing positions have all helped./fim  . Spasms    HISTORY OF PRESENT ILLNESS:  I had the pleasure seeing your patient, Evan Henry, at [facility] for neurologic consultation regarding his thoracic back pain and multiple other neurologic issues  He reports a workplace injury 01/06/2017 when he tried to catch a 5 gallon water jug that was falling at work.  His claim has been settled.    Since then, he has had burning pain in the mid back, numbness in his legs and spasms in his legs, left worse than right.  He also has noted pain under his arms.    Initia;lly he had pain that radiated from the mid thoracic spine to the left chest.   He did some physical therapy but felt that the pain and spasms worsened.  He gets some benefit from hydrocodone prescribed by Dr. Arnoldo Morale.  He also has benefited from resting more and activity restriction.   The back and leg spasms worsen with caffeine and he feels that they are a little better since restricting his caffeine intake.  He also reports other pain in the neck and lower back.   He has a h/o neck pain and disc protrusion at C5C6 was noted on 2009 MRI.  I also looked at the images and he has mild spinal stenosis at C5-C6.    He also has LBP and was told one leg is  slightly shorter than the other leg and a heel lift was prescribed.     Besides the pain related issues, he also has noted more sleepiness, memory difficulty, emotional lability and anxiety/depression.      He feels focus and attention are reduced.  He falls asleep easily most nights but wakes up 3-4 times most nights due to either need to urinate or gasping for air many nights.  He snores and has daytime sleepiness.    He is Epworth sleepiness score is consistent with mild excessive daytime sleepiness (12/24).   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    Multiple MRI's were reviewed.  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  It was stable between 03/04/2006 MRI and 01/14/2007 CT scan.     MRI of the thoracic spine was personally reviewed.  It shows disc protrusions centrally at  T5-T6, centrally at T6-7, to the left at T7-T8, left paramedian at T8-T9 and right paramedian T9-T10.    I personally reviewed the 04/10/2008 images of the cervical spine showing cervical spine disc protrusion at C5C6 right paramedian with mild central canal stenosis and mild right foraminal narrowing.    EPWORTH SLEEPINESS SCALE  On a scale of 0 - 3 what is  the chance of dozing:  Sitting and Reading:   3 Watching TV:    3 Sitting inactive in a public place: 0 Passenger in car for one hour: 0 Lying down to rest in the afternoon: 3 Sitting and talking to someone: 0 Sitting quietly after lunch:  3 In a car, stopped in traffic:  0  Total (out of 24):  12/24     REVIEW OF SYSTEMS: Constitutional: No fevers, chills, sweats, or change in appetite.   Notes insomnia and excessive daytime sleepiness Eyes: No visual changes, double vision, eye pain Ear, nose and throat: No hearing loss, ear pain, nasal congestion, sore throat Cardiovascular: No chest pain,  palpitations Respiratory: No shortness of breath at rest or with exertion.  He snores and occasionally wakes up gasping for air GastrointestinaI: No nausea, vomiting, diarrhea, abdominal pain, fecal incontinence Genitourinary: No dysuria, urinary retention or frequency.  He has nocturia averaging 3 times a night  Musculoskeletal: No neck pain, back pain Integumentary: No rash, pruritus, skin lesions Neurological: as above Psychiatric: Notes depression and anxiety. Endocrine: No palpitations, diaphoresis, change in appetite, change in weigh or increased thirst Hematologic/Lymphatic: No anemia, purpura, petechiae. Allergic/Immunologic: No itchy/runny eyes, nasal congestion, recent allergic reactions, rashes  ALLERGIES: Allergies  Allergen Reactions  . Ciprofloxacin Other (See Comments)    Joint pain   . Levofloxacin Other (See Comments)    Joint pain   . Meloxicam   . Sulfa Antibiotics Nausea And Vomiting and Rash    HOME MEDICATIONS:  Current Outpatient Medications:  .  HYDROcodone-acetaminophen (NORCO/VICODIN) 5-325 MG tablet, Take 1 tablet by mouth every 6 (six) hours as needed for moderate pain., Disp: , Rfl:  .  acetaminophen (TYLENOL) 500 MG tablet, Take 2 tablets (1,000 mg total) by mouth every 6 (six) hours as needed., Disp: 30 tablet, Rfl: 0 .  HYDROcodone-homatropine (HYCODAN) 5-1.5 MG/5ML syrup, Take 5 mLs by mouth every 6 (six) hours as needed for cough., Disp: 120 mL, Rfl: 0 .  ibuprofen (ADVIL,MOTRIN) 800 MG tablet, Take 1 tablet (800 mg total) by mouth 3 (three) times daily., Disp: 21 tablet, Rfl: 0  PAST MEDICAL HISTORY: Past Medical History:  Diagnosis Date  . Back pain   . Neck pain     PAST SURGICAL HISTORY: Past Surgical History:  Procedure Laterality Date  . WISDOM TOOTH EXTRACTION      FAMILY HISTORY: History reviewed. No pertinent family history.  SOCIAL HISTORY:  Social History   Socioeconomic History  . Marital status: Married    Spouse  name: Not on file  . Number of children: Not on file  . Years of education: Not on file  . Highest education level: Not on file  Occupational History  . Not on file  Social Needs  . Financial resource strain: Not on file  . Food insecurity:    Worry: Not on file    Inability: Not on file  . Transportation needs:    Medical: Not on file    Non-medical: Not on file  Tobacco Use  . Smoking status: Never Smoker  . Smokeless tobacco: Never Used  Substance and Sexual Activity  . Alcohol use: Yes    Comment: occ  . Drug use: No  . Sexual activity: Not on file  Lifestyle  . Physical activity:    Days per week: Not on file    Minutes per session: Not on file  . Stress: Not on file  Relationships  . Social connections:    Talks  on phone: Not on file    Gets together: Not on file    Attends religious service: Not on file    Active member of club or organization: Not on file    Attends meetings of clubs or organizations: Not on file    Relationship status: Not on file  . Intimate partner violence:    Fear of current or ex partner: Not on file    Emotionally abused: Not on file    Physically abused: Not on file    Forced sexual activity: Not on file  Other Topics Concern  . Not on file  Social History Narrative  . Not on file     PHYSICAL EXAM  Vitals:   10/08/17 1005  BP: 130/86  Pulse: 93  Resp: 16  Weight: 184 lb (83.5 kg)  Height: '4\' 11"'$  (1.499 m)    Body mass index is 37.16 kg/m.   General: The patient is well-developed and well-nourished and in no acute distress  Eyes:  Funduscopic exam shows normal optic discs and retinal vessels.  Neck: The neck is supple, no carotid bruits are noted.  The neck is mildly tender at the occiput and the mid cervical paraspinal muscles.  Cardiovascular: The heart has a regular rate and rhythm with a normal S1 and S2. There were no murmurs, gallops or rubs. Lungs are clear to auscultation.  Skin: Extremities are without  significant edema.  Musculoskeletal: The back is tender over the rhomboid muscles in the mid to lower thoracic paraspinal muscles  Neurologic Exam  Mental status: The patient is alert and oriented x 3 at the time of the examination. The patient has apparent normal recent and remote memory, with an apparently normal attention span and concentration ability.   Speech is normal.  Cranial nerves: Extraocular movements are full. Pupils are equal, round, and reactive to light and accomodation.  Visual fields are full.  Facial symmetry is present. There is good facial sensation to soft touch bilaterally.Facial strength is normal.  Trapezius and sternocleidomastoid strength is normal. No dysarthria is noted.  The tongue is midline, and the patient has symmetric elevation of the soft palate. No obvious hearing deficits are noted.  Motor:  Muscle bulk is normal.   Tone is normal. Strength is  5 / 5 in all 4 extremities.   Sensory: Sensory testing is intact to pinprick, soft touch and vibration sensation in all 4 extremities.  Coordination: Cerebellar testing reveals good finger-nose-finger and heel-to-shin bilaterally.  Gait and station: Station is normal.   Gait is normal. Tandem gait is normal. Romberg is negative.   Reflexes: Deep tendon reflexes are symmetric and normal in the arms but increased at the knees with spread.  DTRs were normal at the ankles and there was no clonus.   Plantar responses are flexor.    DIAGNOSTIC DATA (LABS, IMAGING, TESTING) - I reviewed patient records, labs, notes, testing and imaging myself where available.  Lab Results  Component Value Date   WBC 5.9 12/11/2009   HGB 15.3 12/11/2009   HCT 45.0 12/11/2009   MCV 87.2 12/11/2009   PLT 225 12/11/2009      Component Value Date/Time   NA 138 12/11/2009 0646   K 4.5 12/11/2009 0646   CL 101 12/11/2009 0646   CO2 30 04/18/2008 1740   GLUCOSE 99 12/11/2009 0646   BUN 9 12/11/2009 0646   CREATININE 0.9  12/11/2009 0646   CALCIUM 9.2 04/18/2008 1740   PROT 7.1 07/30/2007 1715  ALBUMIN 4.2 07/30/2007 1715   AST 17 07/30/2007 1715   ALT 21 07/30/2007 1715   ALKPHOS 59 07/30/2007 1715   BILITOT 0.9 07/30/2007 1715   GFRNONAA >60 04/18/2008 1740   GFRAA  04/18/2008 1740    >60        The eGFR has been calculated using the MDRD equation. This calculation has not been validated in all clinical       ASSESSMENT AND PLAN  Cervical spinal stenosis - Plan: MR CERVICAL SPINE WO CONTRAST  Cervical herniated disc - Plan: MR CERVICAL SPINE WO CONTRAST  Cerebral cysts - Plan: MR BRAIN WO CONTRAST  Memory loss - Plan: MR BRAIN WO CONTRAST  Left ventricular dilatation  Depression with anxiety  Snoring - Plan: Split night study  Excessive daytime sleepiness - Plan: Split night study   In summary, Mr. Gadbois is a 47 year old man with pain in the thoracic back more than the cervical or lumbar region.  He has known multilevel degenerative changes in the thoracic spine.  Pain had increased after a workplace injury last year.  He also has a disc protrusion and spinal stenosis at C5-C6.  He has only had partial response with pain medication.  I will add gabapentin 300 mg in the morning, 300 mg in the afternoon and 600 mg at night.  He is advised to remain active.  We also need to check an MRI of the cervical spine as he has hyperreflexia at the ankles and known cervical spine stenosis that could have worsened over the last decade.   He also reports cognitive issues and emotional lability.  We need to check an MRI of the brain to make sure that there has not been enlargement of the intraventricular cyst that could be causing the symptoms or other processes.  More likely these issues are related to anxiety/depression and possible sleep apnea.  He reports gasping for air at night, snoring and having excessive daytime sleepiness.  We will also check a sleep study.  Additionally I will start Cymbalta to  help with mood issues.  This may also help the pain issues some  He will return to see me in 3 months but sooner if there are new or worsening neurologic symptoms.  Additionally we may bring him in sooner based on the results of the studies and will phone him with the results.  Thank you for asking [place] to see Mr. Callander.  Please let me know if I can be of further assistance with him for other patients in the future.  Richard A. Felecia Shelling, MD, [facility] 6/88/6484, 72:07 AM Certified in Neurology, Clinical Neurophysiology, Sleep Medicine, Pain Medicine and Neuroimaging  [facility] [address] [place], [place] [address] [phone]

## 2017-10-18 ENCOUNTER — Ambulatory Visit
Admission: RE | Admit: 2017-10-18 | Discharge: 2017-10-18 | Disposition: A | Discharge status: Home / Self Care | Payer: BLUE CROSS/BLUE SHIELD | Source: Ambulatory Visit | Attending: Neurology | Admitting: Neurology

## 2017-10-18 ENCOUNTER — Other Ambulatory Visit: Payer: BLUE CROSS/BLUE SHIELD

## 2017-10-18 DIAGNOSIS — M4802 Spinal stenosis, cervical region: Secondary | ICD-10-CM

## 2017-10-18 DIAGNOSIS — M5020 Other cervical disc displacement, unspecified cervical region: Secondary | ICD-10-CM

## 2017-10-18 DIAGNOSIS — G930 Cerebral cysts: Secondary | ICD-10-CM | POA: Diagnosis not present

## 2017-10-18 DIAGNOSIS — R413 Other amnesia: Secondary | ICD-10-CM | POA: Diagnosis not present

## 2017-10-19 ENCOUNTER — Telehealth: Payer: Self-pay | Admitting: *Deleted

## 2017-10-19 NOTE — Telephone Encounter (Signed)
Spoke with Joseantonio and reviewed below MRI report.  He verbalized understanding of same/fim

## 2017-10-19 NOTE — Telephone Encounter (Signed)
-----   Message from Britt Bottom, MD sent at 10/19/2017  3:56 PM EDT ----- Please let him know that the MRI of the brain looks unchanged from his previous CT scan.  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  The MRI of the cervical spine shows degenerative changes at several levels.  He has mild spinal stenosis at C3-C4 and C4-C5 those 2 levels appear a little worse than the previous MRI.  There is no nerve root compression and this does not look bad enough to refer to surgery.

## 2017-11-23 ENCOUNTER — Telehealth: Payer: Self-pay

## 2017-11-23 NOTE — Telephone Encounter (Signed)
Noted/fim 

## 2017-11-23 NOTE — Telephone Encounter (Signed)
First contacted patient on 10/13/17 to schedule sleep study.  At that time, pt was not interested in scheduling and asked for [place] to call back in about a month.  Heard back from pt today, 11/23/17. Pt stated he just started new job, and is on his 72 day probation period.  Pt said that once he is out of that period and can find time to schedule, he would call back at that time and provide new insurance info.

## 2017-12-01 ENCOUNTER — Other Ambulatory Visit (HOSPITAL_COMMUNITY): Payer: Self-pay | Admitting: Pulmonary Disease

## 2017-12-01 DIAGNOSIS — R1084 Generalized abdominal pain: Secondary | ICD-10-CM

## 2017-12-15 ENCOUNTER — Ambulatory Visit (HOSPITAL_COMMUNITY)
Admission: RE | Admit: 2017-12-15 | Discharge: 2017-12-15 | Disposition: A | Discharge status: Home / Self Care | Payer: BLUE CROSS/BLUE SHIELD | Source: Ambulatory Visit | Attending: Pulmonary Disease | Admitting: Pulmonary Disease

## 2017-12-15 ENCOUNTER — Encounter (HOSPITAL_COMMUNITY): Payer: Self-pay

## 2018-01-07 ENCOUNTER — Ambulatory Visit: Payer: BLUE CROSS/BLUE SHIELD | Admitting: Neurology

## 2018-03-25 ENCOUNTER — Ambulatory Visit: Payer: BLUE CROSS/BLUE SHIELD | Admitting: Neurology

## 2018-07-06 DIAGNOSIS — M546 Pain in thoracic spine: Secondary | ICD-10-CM | POA: Diagnosis not present

## 2018-07-06 DIAGNOSIS — M4712 Other spondylosis with myelopathy, cervical region: Secondary | ICD-10-CM | POA: Diagnosis not present

## 2018-07-08 DIAGNOSIS — M5489 Other dorsalgia: Secondary | ICD-10-CM | POA: Diagnosis not present

## 2018-07-09 ENCOUNTER — Other Ambulatory Visit (HOSPITAL_COMMUNITY): Payer: Self-pay | Admitting: Pulmonary Disease

## 2018-07-09 ENCOUNTER — Other Ambulatory Visit: Payer: Self-pay | Admitting: Pulmonary Disease

## 2018-07-09 DIAGNOSIS — M546 Pain in thoracic spine: Secondary | ICD-10-CM

## 2018-07-13 ENCOUNTER — Ambulatory Visit (HOSPITAL_COMMUNITY): Payer: 59

## 2018-07-13 ENCOUNTER — Encounter (HOSPITAL_COMMUNITY): Payer: Self-pay

## 2018-07-13 ENCOUNTER — Telehealth: Payer: Self-pay | Admitting: Pulmonary Disease

## 2018-07-19 ENCOUNTER — Ambulatory Visit (HOSPITAL_COMMUNITY): Payer: 59

## 2018-07-21 DIAGNOSIS — L814 Other melanin hyperpigmentation: Secondary | ICD-10-CM | POA: Diagnosis not present

## 2018-07-21 DIAGNOSIS — Z86018 Personal history of other benign neoplasm: Secondary | ICD-10-CM | POA: Diagnosis not present

## 2018-07-21 DIAGNOSIS — D225 Melanocytic nevi of trunk: Secondary | ICD-10-CM | POA: Diagnosis not present

## 2018-07-28 ENCOUNTER — Ambulatory Visit: Payer: 59 | Admitting: Neurology

## 2018-07-28 ENCOUNTER — Encounter: Payer: Self-pay | Admitting: Neurology

## 2018-07-28 ENCOUNTER — Other Ambulatory Visit: Payer: Self-pay

## 2018-07-28 VITALS — BP 135/82 | HR 88 | Resp 16 | Ht 69.0 in | Wt 183.0 lb

## 2018-07-28 DIAGNOSIS — M5020 Other cervical disc displacement, unspecified cervical region: Secondary | ICD-10-CM | POA: Diagnosis not present

## 2018-07-28 DIAGNOSIS — R202 Paresthesia of skin: Secondary | ICD-10-CM | POA: Diagnosis not present

## 2018-07-28 DIAGNOSIS — R413 Other amnesia: Secondary | ICD-10-CM

## 2018-07-28 DIAGNOSIS — R0683 Snoring: Secondary | ICD-10-CM

## 2018-07-28 DIAGNOSIS — G4719 Other hypersomnia: Secondary | ICD-10-CM

## 2018-07-28 DIAGNOSIS — F418 Other specified anxiety disorders: Secondary | ICD-10-CM

## 2018-07-28 HISTORY — DX: Paresthesia of skin: R20.2

## 2018-07-28 MED ORDER — DULOXETINE HCL 30 MG PO CPEP: ORAL_CAPSULE | ORAL | 5 refills | Status: DC

## 2018-07-28 NOTE — Progress Notes (Signed)
 [facility]  PATIENT: Evan Henry DOB: 09/04/1970  REFERRING DOCTOR OR PCP:  Edward Hawkins SOURCE: patient, notes from PCP, imaging reports and personal review of images on PACS.  _________________________________   HISTORICAL  CHIEF COMPLAINT:  Chief Complaint  Patient presents with  . Back Pain    Rm. 12. Last seen 10/08/17 for back pain, cervical spinal stenosis, spasms both legs, snoring, anxiety, depression.   He never started Gabapentin or Cymbalta, and has not had PSG due to cost. Sts. spasms in legs are worse, he is now having intermittent spasms in torso, right arm, numbness, bilat hands./fim  . Spasms  . Snoring  . Sleep Disturbance    HISTORY OF PRESENT ILLNESS:  Evan Henry is a 47 y.o. man with thoracic back pain and multiple other neurologic issues  07/28/18: At the last visit, gabapentin and Cymbalta were prescribed for his pain and depression.  He continues to report spasms in his legs.    Initially he felt his pain was doing better but the last few months, pain has been worse.   Pain is most intense in the thoracic spine region and worse with movement.   He continues to have spells of numbness in arms and legs.    Sometimes the face is numb and he once woke up with more intense numbness bilaterally in lips, nose, tongue and forehead.   He notes mild dizziness at times.   He saw Dermatology for his thoracic itching sensation and was told skin was fine.      He continues to note anxiety > depression.  He never started Cymbalta.    He feels off balanced at times.    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      Sometimes the left leg buckles.     I personally reviewed the MRI brain 10/18/2017 showing a left lateral intraventricular cyst (stable compared to CT form 2009).  It was otherwise normal.     MRI cervical spine showed multilevel DJD with C3C4 and C4C5 mild spinal stenosis and moderate right foraminal narrowing at C5C6 that could affect  the right C6 nerve root.     From 10/08/17: He reports a workplace injury 01/06/2017 when he tried to catch a 5 gallon water jug that was falling at work.  His claim has been settled.    Since then, he has had burning pain in the mid back, numbness in his legs and spasms in his legs, left worse than right.  He also has noted pain under his arms.    Initia;lly he had pain that radiated from the mid thoracic spine to the left chest.   He did some physical therapy but felt that the pain and spasms worsened.  He gets some benefit from hydrocodone prescribed by Dr. Jenkins.  He also has benefited from resting more and activity restriction.   The back and leg spasms worsen with caffeine and he feels that they are a little better since restricting his caffeine intake.  He also reports other pain in the neck and lower back.   He has a h/o neck pain and disc protrusion at C5C6 was noted on 2009 MRI.  I also looked at the images and he has mild spinal stenosis at C5-C6.    He also has LBP and was told one leg is slightly shorter than the other leg and a heel lift was prescribed.     Besides the pain related issues, he also has   noted more sleepiness, memory difficulty, emotional lability and anxiety/depression.      He feels focus and attention are reduced.  He falls asleep easily most nights but wakes up 3-4 times most nights due to either need to urinate or gasping for air many nights.  He snores and has daytime sleepiness.    He is Epworth sleepiness score is consistent with mild excessive daytime sleepiness (12/24).   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    Multiple MRI's were reviewed.  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  It was stable between 03/04/2006 MRI and 01/14/2007 CT scan.     MRI of the thoracic spine was personally reviewed.  It shows  disc protrusions centrally at  T5-T6, centrally at T6-7, to the left at T7-T8, left paramedian at T8-T9 and right paramedian T9-T10.    I personally reviewed the 04/10/2008 images of the cervical spine showing cervical spine disc protrusion at C5C6 right paramedian with mild central canal stenosis and mild right foraminal narrowing.    EPWORTH SLEEPINESS SCALE  On a scale of 0 - 3 what is the chance of dozing:  Sitting and Reading:   3 Watching TV:    3 Sitting inactive in a public place: 0 Passenger in car for one hour: 0 Lying down to rest in the afternoon: 3 Sitting and talking to someone: 0 Sitting quietly after lunch:  3 In a car, stopped in traffic:  0  Total (out of 24):  12/24     REVIEW OF SYSTEMS: Constitutional: No fevers, chills, sweats, or change in appetite.   Notes insomnia and excessive daytime sleepiness Eyes: No visual changes, double vision, eye pain Ear, nose and throat: No hearing loss, ear pain, nasal congestion, sore throat Cardiovascular: No chest pain, palpitations Respiratory: No shortness of breath at rest or with exertion.  He snores and occasionally wakes up gasping for air GastrointestinaI: No nausea, vomiting, diarrhea, abdominal pain, fecal incontinence Genitourinary: No dysuria, urinary retention or frequency.  He has nocturia averaging 3 times a night  Musculoskeletal: No neck pain, back pain Integumentary: No rash, pruritus, skin lesions Neurological: as above Psychiatric: Notes depression and anxiety. Endocrine: No palpitations, diaphoresis, change in appetite, change in weigh or increased thirst Hematologic/Lymphatic: No anemia, purpura, petechiae. Allergic/Immunologic: No itchy/runny eyes, nasal congestion, recent allergic reactions, rashes  ALLERGIES: Allergies  Allergen Reactions  . Ciprofloxacin Other (See Comments)    Joint pain   . Levofloxacin Other (See Comments)    Joint pain   . Meloxicam   . Sulfa Antibiotics Nausea And  Vomiting and Rash    HOME MEDICATIONS:  Current Outpatient Medications:  .  acetaminophen (TYLENOL) 500 MG tablet, Take 2 tablets (1,000 mg total) by mouth every 6 (six) hours as needed. (Patient not taking: Reported on 07/28/2018), Disp: 30 tablet, Rfl: 0 .  DULoxetine (CYMBALTA) 30 MG capsule, Take one or two daily po, Disp: 60 capsule, Rfl: 5 .  gabapentin (NEURONTIN) 300 MG capsule, One po qAM, one po qPM and two po qHS (Patient not taking: Reported on 07/28/2018), Disp: 120 capsule, Rfl: 11  PAST MEDICAL HISTORY: Past Medical History:  Diagnosis Date  . Back pain   . Neck pain     PAST SURGICAL HISTORY: Past Surgical History:  Procedure Laterality Date  . WISDOM TOOTH EXTRACTION      FAMILY HISTORY:   No family history on file.  SOCIAL HISTORY:  Social History   Socioeconomic History  . Marital status: Married    Spouse name: Not on file  . Number of children: Not on file  . Years of education: Not on file  . Highest education level: Not on file  Occupational History  . Not on file  Social Needs  . Financial resource strain: Not on file  . Food insecurity:    Worry: Not on file    Inability: Not on file  . Transportation needs:    Medical: Not on file    Non-medical: Not on file  Tobacco Use  . Smoking status: Never Smoker  . Smokeless tobacco: Never Used  Substance and Sexual Activity  . Alcohol use: Yes    Comment: occ  . Drug use: No  . Sexual activity: Not on file  Lifestyle  . Physical activity:    Days per week: Not on file    Minutes per session: Not on file  . Stress: Not on file  Relationships  . Social connections:    Talks on phone: Not on file    Gets together: Not on file    Attends religious service: Not on file    Active member of club or organization: Not on file    Attends meetings of clubs or organizations: Not on file    Relationship status: Not on file  . Intimate partner violence:    Fear of current or ex partner: Not on file     Emotionally abused: Not on file    Physically abused: Not on file    Forced sexual activity: Not on file  Other Topics Concern  . Not on file  Social History Narrative  . Not on file     PHYSICAL EXAM  Vitals:   07/28/18 0939  BP: 135/82  Pulse: 88  Resp: 16  Weight: 183 lb (83 kg)  Height: 5' 9" (1.753 m)    Body mass index is 27.02 kg/m.   General: The patient is well-developed and well-nourished and in no acute distress  Skin: Extremities are without rash or edema.  Neurologic Exam  Mental status: The patient is alert and oriented x 3 at the time of the examination. The patient has apparent normal recent and remote memory, with an apparently normal attention span and concentration ability.   Speech is normal.  Cranial nerves: Extraocular movements are full.  Facial strength and sensation is normal.  Trapezius strength normal.  The tongue is midline, and the patient has symmetric elevation of the soft palate. No obvious hearing deficits are noted.  Motor:  Muscle bulk is normal.   Tone is normal. Strength is  5 / 5 in all 4 extremities.   Sensory: Sensory testing is intact to pinprick, soft touch and vibration sensation in all 4 extremities except mild reduced touch right C6 hand.    Coordination: Cerebellar testing reveals good finger-nose-finger and heel-to-shin bilaterally.  Gait and station: Station is normal.   Gait and tandem gait are normal.  Romberg is negative.  Reflexes: Deep tendon reflexes are symmetric and normal in the arms but increased at the knees with spread.  DTRs were normal at the ankles and there was no clonus.   Plantar responses are flexor.    DIAGNOSTIC DATA (LABS, IMAGING, TESTING) - I reviewed patient records, labs, notes, testing and imaging myself where available.  Lab Results  Component Value Date   WBC 5.9 12/11/2009  HGB 15.3 12/11/2009   HCT 45.0 12/11/2009   MCV 87.2 12/11/2009   PLT 225 12/11/2009      Component Value  Date/Time   NA 138 12/11/2009 0646   K 4.5 12/11/2009 0646   CL 101 12/11/2009 0646   CO2 30 04/18/2008 1740   GLUCOSE 99 12/11/2009 0646   BUN 9 12/11/2009 0646   CREATININE 0.9 12/11/2009 0646   CALCIUM 9.2 04/18/2008 1740   PROT 7.1 07/30/2007 1715   ALBUMIN 4.2 07/30/2007 1715   AST 17 07/30/2007 1715   ALT 21 07/30/2007 1715   ALKPHOS 59 07/30/2007 1715   BILITOT 0.9 07/30/2007 1715   GFRNONAA >60 04/18/2008 1740   GFRAA  04/18/2008 1740    >60        The eGFR has been calculated using the MDRD equation. This calculation has not been validated in all clinical       ASSESSMENT AND PLAN  Paresthesia - Plan: NCV with EMG(electromyography)  Cervical herniated disc - Plan: NCV with EMG(electromyography)  Depression with anxiety  Snoring - Plan: PSG SLEEP STUDY  Excessive daytime sleepiness - Plan: PSG SLEEP STUDY  Memory loss   1.   Cymbalta 30 mg x 2 weeks then increase to 60 mg 2.   If he is tolerating Cymbalta well, then start gabapentin 100 mg #150  One po qAM, one po qPM and 3 po qHS 3.    PSG for possible OSA.     Richard A. Sater, MD, PhD,FAAN 07/28/2018, 2:25 PM Certified in Neurology, Clinical Neurophysiology, Sleep Medicine, Pain Medicine and Neuroimaging  [facility] [address], [address] [place], [place] [address] [phone] 

## 2018-07-29 ENCOUNTER — Ambulatory Visit (HOSPITAL_COMMUNITY)
Admission: RE | Admit: 2018-07-29 | Discharge: 2018-07-29 | Disposition: A | Discharge status: Home / Self Care | Payer: 59 | Source: Ambulatory Visit | Attending: Pulmonary Disease | Admitting: Pulmonary Disease

## 2018-07-29 DIAGNOSIS — M5124 Other intervertebral disc displacement, thoracic region: Secondary | ICD-10-CM | POA: Diagnosis not present

## 2018-07-29 DIAGNOSIS — M546 Pain in thoracic spine: Secondary | ICD-10-CM | POA: Insufficient documentation

## 2018-07-29 DIAGNOSIS — M47814 Spondylosis without myelopathy or radiculopathy, thoracic region: Secondary | ICD-10-CM | POA: Diagnosis not present

## 2018-08-05 ENCOUNTER — Telehealth: Payer: Self-pay

## 2018-08-05 DIAGNOSIS — R0683 Snoring: Secondary | ICD-10-CM

## 2018-08-05 DIAGNOSIS — G4719 Other hypersomnia: Secondary | ICD-10-CM

## 2018-08-05 NOTE — Telephone Encounter (Signed)
I placed order

## 2018-08-05 NOTE — Addendum Note (Signed)
Addended by: Hope Pigeon on: 08/05/2018 08:49 AM   Modules accepted: Orders

## 2018-08-05 NOTE — Telephone Encounter (Signed)
Insurance will deny in lab sleep study. Can we get an order for a HST?

## 2018-08-30 ENCOUNTER — Telehealth: Payer: Self-pay

## 2018-08-30 NOTE — Telephone Encounter (Signed)
We have contacted patient to schedule HST. Pt stated that he will need to talk with his insurance company first then will call us back to schedule when he is ready.

## 2018-08-31 ENCOUNTER — Encounter: Payer: 59 | Admitting: Neurology

## 2018-11-09 ENCOUNTER — Encounter: Payer: 59 | Admitting: Neurology

## 2019-01-07 ENCOUNTER — Emergency Department (HOSPITAL_COMMUNITY)
Admission: EM | Admit: 2019-01-07 | Discharge: 2019-01-07 | Disposition: A | Discharge status: Home / Self Care | Payer: 59 | Attending: Emergency Medicine | Admitting: Emergency Medicine

## 2019-01-07 ENCOUNTER — Other Ambulatory Visit: Payer: Self-pay

## 2019-01-07 ENCOUNTER — Encounter (HOSPITAL_COMMUNITY): Payer: Self-pay | Admitting: Emergency Medicine

## 2019-01-07 DIAGNOSIS — R002 Palpitations: Secondary | ICD-10-CM | POA: Diagnosis not present

## 2019-01-07 DIAGNOSIS — R55 Syncope and collapse: Secondary | ICD-10-CM | POA: Insufficient documentation

## 2019-01-07 LAB — BASIC METABOLIC PANEL
Anion gap: 10 (ref 5–15)
BUN: 12 mg/dL (ref 6–20)
CO2: 26 mmol/L (ref 22–32)
Calcium: 8.7 mg/dL — ABNORMAL LOW (ref 8.9–10.3)
Chloride: 102 mmol/L (ref 98–111)
Creatinine, Ser: 0.99 mg/dL (ref 0.61–1.24)
GFR calc Af Amer: >60 mL/min (ref >60)
GFR calc non Af Amer: >60 mL/min (ref >60)
Glucose, Bld: 112 mg/dL — ABNORMAL HIGH (ref 70–99)
Potassium: 3.8 mmol/L (ref 3.5–5.1)
Sodium: 138 mmol/L (ref 135–145)

## 2019-01-07 LAB — CBC
HCT: 42.8 % (ref 39.0–52.0)
Hemoglobin: 14.4 g/dL (ref 13.0–17.0)
MCH: 28.9 pg (ref 26.0–34.0)
MCHC: 33.6 g/dL (ref 30.0–36.0)
MCV: 85.8 fL (ref 80.0–100.0)
Platelets: 286 10*3/uL (ref 150–400)
RBC: 4.99 MIL/uL (ref 4.22–5.81)
RDW: 12.8 % (ref 11.5–15.5)
WBC: 8.1 10*3/uL (ref 4.0–10.5)
nRBC: 0 % (ref 0.0–0.2)

## 2019-01-07 LAB — URINALYSIS, ROUTINE W REFLEX MICROSCOPIC
Bilirubin Urine: NEGATIVE
Glucose, UA: NEGATIVE mg/dL
Hgb urine dipstick: NEGATIVE
Ketones, ur: NEGATIVE mg/dL
Leukocytes,Ua: NEGATIVE
Nitrite: NEGATIVE
Protein, ur: 30 mg/dL — AB
Specific Gravity, Urine: 1.016 (ref 1.005–1.030)
pH: 6 (ref 5.0–8.0)

## 2019-01-07 LAB — CBG MONITORING, ED: Glucose-Capillary: 96 mg/dL (ref 70–99)

## 2019-01-07 MED ORDER — SODIUM CHLORIDE 0.9 % IV BOLUS
1000.0000 mL | Freq: Once | INTRAVENOUS | Status: AC
  Administered 2019-01-07: 1000 mL via INTRAVENOUS

## 2019-01-07 MED ORDER — SODIUM CHLORIDE 0.9% FLUSH: 3.0000 mL | Freq: Once | INTRAVENOUS | Status: DC

## 2019-01-07 NOTE — ED Provider Notes (Signed)
[facility] Provider Note   CSN: [id number] Arrival date & time: 01/07/19  0423    History   Chief Complaint Chief Complaint  Patient presents with  . Palpitations  . Near Syncope    HPI Evan Henry is a 48 y.o. male.     Patient presents to the emergency department for evaluation of near syncope.  Patient reports that he woke up and felt a fluttering in his chest.  He felt like his heart was racing.  His wife checked his pulse and said it was fast and he was "skipping beats".  He got up and went to the bathroom and continued to feel bad, started to feel dizzy like he was going to pass out.  He checked his blood pressure on his blood pressure device and it was low, systolic in the 10X.  Brought to the ER by EMS.  Initially blood pressure was low but this has resolved during transport without intervention.  Patient reports that he has not been drinking much lately, thinks he might be dehydrated.  He is not experiencing chest pain.  He is concerned because there is some family history of heart disease.     Past Medical History:  Diagnosis Date  . Back pain   . Neck pain     Patient Active Problem List   Diagnosis Date Noted  . Paresthesia 07/28/2018  . Memory loss 10/08/2017  . Depression with anxiety 10/08/2017  . Snoring 10/08/2017  . Excessive daytime sleepiness 10/08/2017  . Cervical herniated disc 07/28/2017  . Leg length discrepancy 07/28/2017  . Varicocele 04/19/2013  . Family history of premature CAD 12/15/2012  . GERD (gastroesophageal reflux disease) 08/17/2012  . 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 Hepatic hemangioma 04/18/2010  . 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 ALLERGIC RHINITIS 12/25/2009  . ACUTE BRONCHITIS 12/21/2009  . DYSPNEA 12/21/2009  . CHEST WALL PAIN, HX OF 12/21/2009    Past Surgical History:  Procedure Laterality Date  . WISDOM TOOTH EXTRACTION          Home Medications    Prior to Admission medications   Medication Sig Start Date End Date Taking? Authorizing Provider  acetaminophen (TYLENOL) 500 MG tablet Take 2 tablets (1,000 mg total) by mouth every 6 (six) hours as needed. Patient not taking: Reported on 07/28/2018 08/25/17   Charlesetta Shanks, MD  DULoxetine (CYMBALTA) 30 MG capsule Take one or two daily po Patient not taking: Reported on 01/07/2019 07/28/18   Britt Bottom, MD  gabapentin (NEURONTIN) 300 MG capsule One po qAM, one po qPM and two po qHS Patient not taking: Reported on 07/28/2018 10/08/17   Britt Bottom, MD    Family History No family history on file.  Social History Social History   Tobacco Use  . Smoking status: Never Smoker  . Smokeless tobacco: Never Used  Substance Use Topics  . Alcohol use: Yes    Comment: occ  . Drug use: No     Allergies   Ciprofloxacin, Levofloxacin, Meloxicam, and Sulfa antibiotics   Review of Systems Review of Systems  Cardiovascular: Positive for palpitations.  Neurological: Positive for dizziness.  All other systems reviewed and are negative.    Physical Exam Updated Vital Signs BP 105/87   Pulse 76   Temp 98 F (36.7 C)   Resp 17   Ht 5\' 9"  (1.753 m)   Wt 81.6 kg   SpO2 98%  BMI 26.58 kg/m   Physical Exam Vitals signs and nursing note reviewed.  Constitutional:      General: He is not in acute distress.    Appearance: Normal appearance. He is well-developed.  HENT:     Head: Normocephalic and atraumatic.     Right Ear: Hearing normal.     Left Ear: Hearing normal.     Nose: Nose normal.  Eyes:     Conjunctiva/sclera: Conjunctivae normal.     Pupils: Pupils are equal, round, and reactive to light.  Neck:     Musculoskeletal: Normal range of motion and neck supple.  Cardiovascular:     Rate and Rhythm: Regular rhythm.     Heart sounds: S1 normal and S2 normal. No murmur. No friction rub. No gallop.   Pulmonary:     Effort: Pulmonary effort is  normal. No respiratory distress.     Breath sounds: Normal breath sounds.  Chest:     Chest wall: No tenderness.  Abdominal:     General: Bowel sounds are normal.     Palpations: Abdomen is soft.     Tenderness: There is no abdominal tenderness. There is no guarding or rebound. Negative signs include Murphy's sign and McBurney's sign.     Hernia: No hernia is present.  Musculoskeletal: Normal range of motion.  Skin:    General: Skin is warm and dry.     Findings: No rash.  Neurological:     Mental Status: He is alert and oriented to person, place, and time.     GCS: GCS eye subscore is 4. GCS verbal subscore is 5. GCS motor subscore is 6.     Cranial Nerves: No cranial nerve deficit.     Sensory: No sensory deficit.     Coordination: Coordination normal.  Psychiatric:        Speech: Speech normal.        Behavior: Behavior normal.        Thought Content: Thought content normal.      ED Treatments / Results  Labs (all labs ordered are listed, but only abnormal results are displayed) Labs Reviewed  BASIC METABOLIC PANEL - Abnormal; Notable for the following components:      Result Value   Glucose, Bld 112 (*)    Calcium 8.7 (*)    All other components within normal limits  CBC  URINALYSIS, ROUTINE W REFLEX MICROSCOPIC  CBG MONITORING, ED    EKG EKG Interpretation  Date/Time:  Friday January 07 2019 04:30:39 EDT Ventricular Rate:  76 PR Interval:    QRS Duration: 91 QT Interval:  386 QTC Calculation: 434 R Axis:   50 Text Interpretation:  Sinus rhythm Normal ECG Confirmed by Orpah Greek [phone]) on 01/07/2019 4:31:56 AM   Radiology No results found.  Procedures Procedures (including critical care time)  Medications Ordered in ED Medications  sodium chloride flush (NS) 0.9 % injection 3 mL (has no administration in time range)  sodium chloride 0.9 % bolus 1,000 mL (1,000 mLs Intravenous New Bag/Given 01/07/19 0530)     Initial Impression /  Assessment and Plan / ED Course  I have reviewed the triage vital signs and the nursing notes.  Pertinent labs & imaging results that were available during my care of the patient were reviewed by me and considered in my medical decision making (see chart for details).        Patient appears well.  He had an episode of palpitations earlier followed by near  syncope.  Blood pressure was initially low but has improved without intervention.  Vital signs are normal at arrival to the ER.  He is not experiencing any associated chest pain.  EKG unremarkable.  Lab work performed, reassuring.  Patient hydrated here, no orthostasis.  Will have patient see cardiology as an outpatient for further evaluation, return if symptoms worsen.  Final Clinical Impressions(s) / ED Diagnoses   Final diagnoses:  Palpitations  Near syncope    ED Discharge Orders    None       Lydia Meng, Gwenyth Allegra, MD 01/07/19 [phone]

## 2019-01-07 NOTE — ED Triage Notes (Signed)
Per EMS, Pt woke up w/ "a fluttery feeling" and when he got up he felt "dizzy like I was going to pass out."  Checked bp on home device 76/30, 801/40 (EMS).  Feeling is now gone.  Reports family history of early heart attacks (bother 2, father 44's).

## 2019-01-18 ENCOUNTER — Other Ambulatory Visit (HOSPITAL_COMMUNITY): Payer: Self-pay | Admitting: Pulmonary Disease

## 2019-01-18 DIAGNOSIS — R609 Edema, unspecified: Secondary | ICD-10-CM

## 2019-01-19 ENCOUNTER — Other Ambulatory Visit (HOSPITAL_COMMUNITY): Payer: Self-pay | Admitting: Pulmonary Disease

## 2019-01-19 ENCOUNTER — Other Ambulatory Visit: Payer: Self-pay | Admitting: Pulmonary Disease

## 2019-01-19 DIAGNOSIS — M79604 Pain in right leg: Secondary | ICD-10-CM

## 2019-01-21 ENCOUNTER — Ambulatory Visit (HOSPITAL_COMMUNITY): Payer: 59

## 2019-01-26 ENCOUNTER — Ambulatory Visit (HOSPITAL_COMMUNITY): Admission: RE | Admit: 2019-01-26 | Payer: 59 | Source: Ambulatory Visit

## 2019-01-26 ENCOUNTER — Telehealth: Payer: Self-pay

## 2019-01-26 NOTE — Telephone Encounter (Signed)
NOTES ON FILE AND FAXED TO [facility]

## 2019-01-27 ENCOUNTER — Other Ambulatory Visit: Payer: Self-pay

## 2019-01-27 ENCOUNTER — Observation Stay (HOSPITAL_COMMUNITY)
Admission: RE | Admit: 2019-01-27 | Discharge: 2019-01-27 | Disposition: A | Discharge status: Home / Self Care | Payer: 59 | Source: Ambulatory Visit | Attending: Pulmonary Disease | Admitting: Pulmonary Disease

## 2019-01-27 ENCOUNTER — Ambulatory Visit (HOSPITAL_COMMUNITY)
Admission: RE | Admit: 2019-01-27 | Discharge: 2019-01-27 | Disposition: A | Discharge status: Home / Self Care | Payer: 59 | Source: Ambulatory Visit | Attending: Pulmonary Disease | Admitting: Pulmonary Disease

## 2019-01-27 DIAGNOSIS — R609 Edema, unspecified: Secondary | ICD-10-CM | POA: Diagnosis not present

## 2019-01-27 DIAGNOSIS — I350 Nonrheumatic aortic (valve) stenosis: Secondary | ICD-10-CM | POA: Diagnosis not present

## 2019-01-27 DIAGNOSIS — M79604 Pain in right leg: Secondary | ICD-10-CM | POA: Diagnosis not present

## 2019-01-27 NOTE — Progress Notes (Signed)
*  PRELIMINARY RESULTS* Echocardiogram 2D Echocardiogram has been performed.  Evan Henry 01/27/2019, 8:38 AM

## 2019-02-16 ENCOUNTER — Ambulatory Visit (INDEPENDENT_AMBULATORY_CARE_PROVIDER_SITE_OTHER): Payer: 59 | Admitting: Neurology

## 2019-02-16 DIAGNOSIS — G4719 Other hypersomnia: Secondary | ICD-10-CM

## 2019-02-16 DIAGNOSIS — G4710 Hypersomnia, unspecified: Secondary | ICD-10-CM | POA: Diagnosis not present

## 2019-02-16 DIAGNOSIS — R0683 Snoring: Secondary | ICD-10-CM

## 2019-02-17 ENCOUNTER — Other Ambulatory Visit: Payer: Self-pay

## 2019-02-21 ENCOUNTER — Other Ambulatory Visit: Payer: Self-pay | Admitting: General Surgery

## 2019-02-21 NOTE — Progress Notes (Signed)
      Patient Information     First Name: Evan Last Name: Henry ID: [id number]  Birth Date: 06/12/1971 Age: 48 Gender:  Referring Provider: Sinda Du, MD BMI: 27.1 (W=183 lb, H=5' 9'')     Epworth:  12   Sleep Study Information    Study Date: Feb 17, 2019 S/H/A Version: 001.001.001.001 / 4.1.1528 / 19  History:       .  .  .   Mr. Segura is a 48 year old man with snoring and excessive daytime sleepiness.  The Epworth sleepiness scale score is 12/24 consistent with mild excessive daytime sleepiness.  Additionally, he has leg spasms and pain and spinal stenosis.  Summary & Diagnosis:    1.   Mild obstructive sleep apnea with an AHI of 7.2.  The oxygen nadir was 92.   Recommendations:     1. Because the obstructive sleep apnea is mild, he has several options.  We can do a CPAP titration study.  Alternatively, as a sleep apnea is mild, consider an oral appliance.  2. Follow-up with Dr. Felecia Shelling.  Kortney Schoenfelder A. Felecia Shelling, MD, PhD, FAAN Certified in Neurology, Clinical Neurophysiology, Sleep Medicine, Pain Medicine and Neuroimaging Director, [facility] [address] [place], [place] [address] [phone]       Sleep Summary  Oxygen Saturation Statistics   Start Study Time: End Study Time: Total Recording Time:  12:29:16AM 7:05:14 AM 6 hrs, 24min  Total Sleep Time % REM of Sleep Time:  5 hrs, 35 min 23.8    Mean: 95 Minimum: 92 Maximum: 99  Mean of Desaturations Nadirs (%):   93  Oxygen Desatur. %: 4-9 10-20 >20 Total  Events Number Total  5 100.0  0 0.0  0 0.0  5 100.0  Oxygen Saturation: <90 <=88 <85 <80 <70  Duration (minutes): Sleep % 0.0 0.0 0.0 0.0 0.0 0.0 0.0 0.0 0.0 0.0     Respiratory Indices      Total Events REM NREM All Night  pRDI:  108  pAHI:  40 ODI:  5  pAHIc:  0  % CSR: 0.0 31.2 16.0 0.8 0.0 15.9 4.5 1.0 0.0 19.5 7.2 0.9 0.0       Pulse  Rate Statistics during Sleep (BPM)      Mean: 70 Minimum: 52 Maximum: 126    Indices are calculated using technically valid sleep time of  5 hrs, 32 min. Central-Indices are calculated using technically valid sleep time of  5  hrs, 13 min. pRDI/pAHI are calculated using oxi desaturations ? 3%     Body Position Statistics Position Supine Prone Right Left Non-Supine  Sleep (min) 114.5 166.5 19.0 36.0 221.5  Sleep % 34.1 49.5 5.7 10.7 65.9  pRDI 8.1 24.2 25.6 30.0 25.3  pAHI 4.3 5.4 16.0 20.0 8.7  ODI 0.5 0.4 0.0 5.0 1.1     Snoring Level (dB) >40 >50 >60 >70 >80 >Threshold (45)  Sleep (min) 200.7 2.8 0.6 0.0 0.0 5.4  Sleep % 59.7 0.8 0.2 0.0 0.0 1.6

## 2019-02-26 ENCOUNTER — Telehealth: Payer: Self-pay | Admitting: Cardiology

## 2019-02-26 NOTE — Telephone Encounter (Signed)
Patient called due to elevated BP.  He has an appt with Dr. Percival Spanish later this month but has never seen Cardiology yet.  He was supposed to be seen due to an ER visit for hypotension and palpitations.  His low BP improved but still soft with SBP around 61mmhg since then.  Echo was normal.  Now says that his BP is running elevated at 143/127mmHg.   Explained to him that since cardiology has not formally evaluated him yet, would feel more comfortable with him calling his PCP.  He says his PCP is Dr. Luan Pulling and is not sure if he is on call at night.  He says that he does not feel well and has a headache and is feeling weak.  Since he is having sx recommended going to ER for further evaluation. Patient agrees with plan.

## 2019-03-09 NOTE — Progress Notes (Signed)
Cardiology Office Note   Date:  03/10/2019   ID:  Evan Henry, DOB [birth year], MRN [id number]  PCP:  Sinda Du, MD  Cardiologist:   No primary care provider on file. Referring:  Sinda Du, MD  No chief complaint on file.      History of Present Illness: Evan Henry is a 48 y.o. male who is referred by Sinda Du, MD for evaluation of an episode of hypotension followed by hypertension and dizziness.  He was in the hospital in the emergency room in July and I reviewed these records for this.  He is also being seen preoperatively prior to getting what is likely a lipoma removed.  As part of a work-up he had an echo in August that demonstrated a well-preserved ejection fraction of 60 to 65%.  He has had a sleep study and he has not had the results yet but I am able to see that he has mild sleep apnea.  He says that in July he woke up and was not feeling well.  He went to the bathroom.  He felt palpitations and weak.  His wife took his blood pressure was 79/50.  EMS came and his blood sugars were okay.  He went to the emergency room where he was found to have a blood pressure of 105/87.  He had no acute EKG abnormalities.  Again I reviewed these records for this visit.  Since that time he has felt kind of edgy and had some hand swelling.  He had some groin pain and had an ultrasound of his leg that demonstrated no DVT.  His blood pressures have actually been creeping up and his systolics have been typically in the 130s to 0000000 but his diastolics are typically above 90s.  He does get some perhaps mild dyspnea with exertion.  He does walk at work.  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  He does not get chest pressure, neck or arm discomfort.  He does occasionally have palpitations.  Past Medical History:  Diagnosis Date  . Acute bronchitis 12/21/2009   Qualifier: History of  By: Annamaria Boots MD, Clinton D   . ALLERGIC RHINITIS 12/25/2009   Qualifier: Diagnosis of  By: Annamaria Boots MD, Clinton D   . Cervical herniated disc 07/28/2017   Overview:  [facility] - Dr. Collier Salina (retired)  . 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 Depression with anxiety 10/08/2017  . Excessive daytime sleepiness 10/08/2017  . GERD (gastroesophageal reflux disease) 08/17/2012  . 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 Ultrasound/cla  . Leg length discrepancy 07/28/2017  . Paresthesia 07/28/2018  . Snoring 10/08/2017  . 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 Earlean Shawl - q 5 years colonoscopy. Last 2013/cla  . Varicocele 04/19/2013   Overview:  Dr. Karsten Ro    Past Surgical History:  Procedure Laterality Date  . WISDOM TOOTH EXTRACTION      Current Outpatient Medications  Medication Sig Dispense Refill  . chlorthalidone (HYGROTON) 25 MG tablet Take 0.5 tablets (12.5 mg total) by mouth daily. 45 tablet 3   No current facility-administered medications for this visit.     Allergies:   Ciprofloxacin, Levofloxacin, Meloxicam, and Sulfa antibiotics    Social History:  The patient  reports that he has never smoked. He  has never used smokeless tobacco. He reports current alcohol use. He reports that he does not use drugs.   Family History:  The patient's family history includes Emphysema in his mother; Heart attack (age of onset: 43) in his brother; Heart attack (age of onset: 56) in his father; Stroke in his brother.    ROS:  Please see the history of present illness.   Otherwise, review of systems are positive for back pain.   All other systems are reviewed and negative.    PHYSICAL EXAM: VS:  BP (!) 136/92   Pulse 81   Ht 5\' 9"  (1.753 m)   Wt 181 lb (82.1 kg)   SpO2 98%   BMI 26.73 kg/m  , BMI Body mass index is 26.73 kg/m. GENERAL:  Well appearing HEENT:   Pupils equal round and reactive, fundi not visualized, oral mucosa unremarkable NECK:  No jugular venous distention, waveform within normal limits, carotid upstroke brisk and symmetric, no bruits, no thyromegaly LYMPHATICS:  No cervical, inguinal adenopathy LUNGS:  Clear to auscultation bilaterally BACK:  No CVA tenderness CHEST:  Unremarkable HEART:  PMI not displaced or sustained,S1 and S2 within normal limits, no S3, no S4, no clicks, no rubs, no murmurs ABD:  Flat, positive bowel sounds normal in frequency in pitch, no bruits, no rebound, no guarding, no midline pulsatile mass, no hepatomegaly, no splenomegaly EXT:  2 plus pulses throughout, no edema, no cyanosis no clubbing SKIN:  No rashes no nodules NEURO:  Cranial nerves II through XII grossly intact, motor grossly intact throughout PSYCH:  Cognitively intact, oriented to person place and time    EKG:  EKG is ordered today. The ekg ordered today demonstrates sinus rhythm, rate 81, axis within normal limits, intervals within normal limits, no acute ST-T wave changes.   Recent Labs: 01/07/2019: BUN 12; Creatinine, Ser 0.99; Hemoglobin 14.4; Platelets 286; Potassium 3.8; Sodium 138    Lipid Panel No results found for: CHOL, TRIG, HDL, CHOLHDL, VLDL, LDLCALC, LDLDIRECT    Wt Readings from Last 3 Encounters:  03/10/19 181 lb (82.1 kg)  01/07/19 180 lb (81.6 kg)  07/28/18 183 lb (83 kg)      Other studies Reviewed: Additional studies/ records that were reviewed today include: Hospital records. Review of the above records demonstrates:  Please see elsewhere in the note.     ASSESSMENT AND PLAN:  PALPITATIONS:    Patient's palpitations are not very frequent.  He prefer not to be on medications.  He is gong to get an Alive Cor and will let me know if he has any further arrhythmias.  PREOP: He needs preop clearance.  This will be based on the results of the study below.  SOB: He has some vague symptoms of shortness of  breath.  He has a strong family history of early coronary artery disease.  He is going to have a surgical procedure. I will bring the patient back for a POET (Plain Old Exercise Test). This will allow me to screen for obstructive coronary disease, risk stratify and very importantly provide a prescription for exercise.  HTN: Blood pressures running slightly high.  We talked about this at length.  Going to start him on chlorthalidone 12.5 mg daily.  He will keep a blood pressure diary.  SLEEP APNEA: He has mild sleep apnea and the suggestion is either an appliance or CPAP and he will discuss this with the sleep physicians.  I reviewed this briefly with him.  Current medicines are reviewed  at length with the patient today.  The patient does not have concerns regarding medicines.  The following changes have been made:  As above  Labs/ tests ordered today include:   Orders Placed This Encounter  Procedures  . EXERCISE TOLERANCE TEST (ETT)  . EKG 12-Lead     Disposition:   FU with me as needed based on the results of the above    Signed, [name], MD  03/10/2019 6:09 PM    [place]

## 2019-03-10 ENCOUNTER — Ambulatory Visit: Payer: 59 | Admitting: Cardiology

## 2019-03-10 ENCOUNTER — Encounter: Payer: Self-pay | Admitting: Cardiology

## 2019-03-10 ENCOUNTER — Other Ambulatory Visit: Payer: Self-pay

## 2019-03-10 VITALS — BP 136/92 | HR 81 | Ht 69.0 in | Wt 181.0 lb

## 2019-03-10 DIAGNOSIS — R002 Palpitations: Secondary | ICD-10-CM | POA: Diagnosis not present

## 2019-03-10 MED ORDER — CHLORTHALIDONE 25 MG PO TABS: 12.5000 mg | ORAL_TABLET | Freq: Every day | ORAL | 3 refills | Status: DC

## 2019-03-10 NOTE — Patient Instructions (Addendum)
Medication Instructions:  Take 12.5mg  of Chlorthalidone daily. Half a tablet.  If you need a refill on your cardiac medications before your next appointment, please call your pharmacy.   Lab work: NONE  Testing/Procedures: Your physician has requested that you have an exercise tolerance test. For further information please visit [email or web address]. Please also follow instruction sheet, as given. You do not need to hold any medications.   Follow-Up: At [facility], you and your health needs are our priority.  As part of our continuing mission to provide you with exceptional heart care, we have created designated Provider Care Teams.  These Care Teams include your primary Cardiologist (physician) and Advanced Practice Providers (APPs -  Physician Assistants and Nurse Practitioners) who all work together to provide you with the care you need, when you need it. You will need a follow up appointment in 12 months.  Please call our office 2 months in advance to schedule this appointment.  You may see Dr. Percival Spanish or one of the following Advanced Practice Providers on your designated Care Team:   Rosaria Ferries, PA-C Jory Sims, DNP, ANP  Any Other Special Instructions Will Be Listed Below (If Applicable). You have to have a Covid test prior to your exercise tolerance test. We will call you to schedule.

## 2019-03-16 ENCOUNTER — Telehealth: Payer: Self-pay | Admitting: Cardiology

## 2019-03-16 ENCOUNTER — Telehealth: Payer: Self-pay | Admitting: Neurology

## 2019-03-16 DIAGNOSIS — G4733 Obstructive sleep apnea (adult) (pediatric): Secondary | ICD-10-CM

## 2019-03-16 NOTE — Telephone Encounter (Signed)
Dr. Felecia Shelling- do you have these results?

## 2019-03-16 NOTE — Telephone Encounter (Addendum)
Called pt back and relayed Dr. Garth Bigness recommendation. He would prefer to try autopap machine and use [employer]. He would like note asking to work with Danae Chen there. Advised we will place referral and he will be called to schedule. Made initial autopap f/u for 06/02/19 at 4pm with Dr. Felecia Shelling.    Faxed referral to them at [phone]. Received fax confirmation.

## 2019-03-16 NOTE — Telephone Encounter (Signed)
Returned call to patient of Dr. Percival Spanish. He has NOT started chlorthalidone 12.5mg  yet. He states he was having swelling in his hands and high diastolic BP, running mid-high 90s. He does not drink enough water during the day, has a dry mouth at night and is worried about starting this medication and it causing him to be dehydrated. He would like MD to review and maybe prescribe a different medication   Routed to Dr. Percival Spanish

## 2019-03-16 NOTE — Addendum Note (Signed)
Addended by: Hope Pigeon on: 03/16/2019 03:54 PM   Modules accepted: Orders

## 2019-03-16 NOTE — Telephone Encounter (Signed)
Pt states he had the sleep study weeks ago, pt has not been contacted with results.  Pt wants to know if a CPAP will be ordered for him

## 2019-03-16 NOTE — Telephone Encounter (Signed)
New message   Pt c/o medication issue:  1. Name of Medication: chlorthalidone (HYGROTON) 25 MG tablet  2. How are you currently taking this medication (dosage and times per day)? n/a  3. Are you having a reaction (difficulty breathing--STAT)? n/a  4. What is your medication issue? Patient states that he is concerned about pill causing dehydration. Please call to discuss.

## 2019-03-16 NOTE — Telephone Encounter (Signed)
Home sleep study showed mild OSA.  We can order an autoPAP titration (range 5-20 cm) with any of the DME companies Alternatively, if he would rather do an oral appliance, we could refer to one of the dentists who make appliances.

## 2019-03-17 ENCOUNTER — Encounter (HOSPITAL_COMMUNITY): Payer: Self-pay | Admitting: Cardiology

## 2019-03-17 MED ORDER — AMLODIPINE BESYLATE 2.5 MG PO TABS: 2.5000 mg | ORAL_TABLET | Freq: Every day | ORAL | 3 refills | Status: DC

## 2019-03-17 NOTE — Telephone Encounter (Signed)
He can start Norvasc 2.5 mg daily.  Disp number 30 with 11 refills.  Stop the chlorthalidone.

## 2019-03-17 NOTE — Telephone Encounter (Signed)
Advised patient, verbalized understanding  

## 2019-03-19 ENCOUNTER — Telehealth: Payer: Self-pay | Admitting: Cardiology

## 2019-03-19 NOTE — Telephone Encounter (Signed)
I returned a call to the patient and spoke to his wife. She reports that the patient has experienced elevated BP and is currently 172/103. HR 99. On my questioning, he endorses headache. He only has amlodipine with him at home.   Given his report of symptoms associated with high BP, he was advised to go to urgent care or the ED for evaluation.

## 2019-03-20 ENCOUNTER — Telehealth: Payer: Self-pay | Admitting: Cardiovascular Disease

## 2019-03-20 NOTE — Telephone Encounter (Signed)
Cardiology Telephone Note  [name] called regarding his blood pressure.  He started 2.5 mg amlodipine three days ago but continues to have elevated blood pressures (systolic 99991111 at times overnight last night, currently 146/97).  I advised him to take an extra 2.5 mg amlodipine tonight, and to start taking 5 mg amlodipine daily tomorrow morning.  He will call the office later this week to let us know how this is working.

## 2019-03-23 ENCOUNTER — Telehealth (HOSPITAL_COMMUNITY): Payer: Self-pay

## 2019-03-23 NOTE — Telephone Encounter (Signed)
We can wait until the sinus problem is improved and he is able to get the COVID test.

## 2019-03-23 NOTE — Telephone Encounter (Signed)
New message   Call the patient to set up his Exercise Tolerance Test order by Dr. Percival Spanish.   The patient has a bad sinus problem does not want to have a COVID test done.   The patient is asking for Alternate  testing where he does not have to take the COVID test.    The patient is asking for a callback.

## 2019-03-24 ENCOUNTER — Telehealth: Payer: Self-pay | Admitting: Cardiology

## 2019-03-24 NOTE — Telephone Encounter (Signed)
BP seems OK with recent change.  Continue current meds.

## 2019-03-24 NOTE — Telephone Encounter (Signed)
1) Patient stated sinus issues are chronic and wants to know if there is an alternative to the POET  2) Patient spoke with on call MD 03/20/2019 secondary to elevated blood pressure and Amlodipine was increased to 5 mg daily 9/27 & 9/28. He has been taking 5 mg daily since and he wants Dr Percival Spanish to be aware DBP is in the upper 80's and HR upper 80's-90's at rest.   Will forward to Dr Percival Spanish for review

## 2019-03-24 NOTE — Telephone Encounter (Signed)
New Message    Pt c/o medication issue:  1. Name of Medication: Amlodipine   2. How are you currently taking this medication (dosage and times per day)?  2.5mg  once daily  3. Are you having a reaction (difficulty breathing--STAT)? No  4. What is your medication issue? Patient states he called in after hours and was instructed to doubled up on his medication.  Please call patient to discuss.

## 2019-03-24 NOTE — Telephone Encounter (Signed)
I think we could wait to have the POET (Plain Old Exercise Treadmill) until his sinus clears up.  My understanding is he doesn't want the COVID test because of his sinus problem.  Probably should have any testing until this clears up.  We can then reschedule the POET (Plain Old Exercise Treadmill)

## 2019-03-24 NOTE — Telephone Encounter (Signed)
Left message to call back  

## 2019-03-25 NOTE — Telephone Encounter (Signed)
Left message to call back  

## 2019-03-28 ENCOUNTER — Telehealth: Payer: Self-pay

## 2019-03-28 DIAGNOSIS — R002 Palpitations: Secondary | ICD-10-CM

## 2019-03-28 DIAGNOSIS — R0602 Shortness of breath: Secondary | ICD-10-CM

## 2019-03-28 NOTE — Telephone Encounter (Signed)
Patient returning a call from our office from 10/01

## 2019-03-28 NOTE — Telephone Encounter (Signed)
Called pt back about BP concerns. He states that when he is resting his BP stays around 130/90. He states that when he is doing yard work or walking to [employer], his systolic had gotten in the 99991111 and diastolic in the [id number] and it takes about 4 hours to come back down. He states that his BP is high while doing activities and when he first wakes up. He also said he has sleep apnea and was educated that sleep apnea could cause high BP's. He questioned if he should take baby aspirin and if he can start exercising.   The pt stated that he has "narrow nasal cavities" and that he did not think he would be able to get a covid test for a GXT. The pt asked if their was another test that could be performed where he does not have to get a covid test.   He was advised to continue his current medications and if his BP ever sustains over 160s/100s to call us back. Advised this message would go to Dr. Percival Spanish.

## 2019-03-28 NOTE — Telephone Encounter (Signed)
He can have a coronary calcium score.

## 2019-03-29 ENCOUNTER — Telehealth: Payer: Self-pay

## 2019-03-29 DIAGNOSIS — R072 Precordial pain: Secondary | ICD-10-CM

## 2019-03-29 NOTE — Telephone Encounter (Signed)
This is addressed in prev telephone not dated March 23, 2019

## 2019-03-29 NOTE — Telephone Encounter (Signed)
Advised patient, verbalized understanding. Message sent to scheduling to arrange Calcium Score

## 2019-03-29 NOTE — Telephone Encounter (Signed)
LMTCB. Put in coronary calcium score order.

## 2019-03-29 NOTE — Telephone Encounter (Signed)
See phone note 10/5

## 2019-04-25 ENCOUNTER — Other Ambulatory Visit: Payer: Self-pay | Admitting: Cardiology

## 2019-04-25 ENCOUNTER — Telehealth: Payer: Self-pay | Admitting: Cardiology

## 2019-04-25 NOTE — Telephone Encounter (Signed)
°*  STAT* If patient is at the pharmacy, call can be transferred to refill team.   1. Which medications need to be refilled? (please list name of each medication and dose if known) amLODipine ([place])   2. Which pharmacy/location (including street and city if local pharmacy) is medication to be sent to? [id number] - , [place] [address]  3. Do they need a 30 day or 90 day supply? 90  Patient states that he needs the 5mg  tablet and not the 2.5mg  tablet. States he was told to increase the dosage by the after hours pharmacy.

## 2019-04-25 NOTE — Telephone Encounter (Signed)
Pt explaining how his heart feels out of rhythm. He states he notices his HR increases when he eats and it takes a while to decrease. He started taking 5mg  Amlodipine on 9/27 for increased BP. Asked pt if he was having any other symptoms, said he gets winded every now and then but no other symptoms. He states that his HR is getting up in the 100s and it normally is steady in the 80s. Wants to know what Dr Percival Spanish wants to do. Will forward to Dr for review.

## 2019-04-25 NOTE — Telephone Encounter (Signed)
Patient c/o Palpitations:  High priority if patient c/o lightheadedness, shortness of breath, or chest pain  1) How long have you had palpitations/irregular HR/ Afib? Are you having the symptoms now? Past couple of weeks and not experiencing it now.   Are you currently experiencing lightheadedness, SOB or CP?  Occasional light headedness  2) Do you have a history of afib (atrial fibrillation) or irregular heart rhythm? no  3) Have you checked your BP or HR? (document readings if available): has it written down at home but is at work now.   4) Are you experiencing any other symptoms? States his HR is usually in the 70's while resting but lately has been in the 100's while resting or eating and last for a few hours afterwards.

## 2019-04-26 MED ORDER — AMLODIPINE BESYLATE 5 MG PO TABS: 5.0000 mg | ORAL_TABLET | Freq: Every day | ORAL | 3 refills | Status: DC

## 2019-04-26 NOTE — Telephone Encounter (Signed)
Pt did not get the Alive Cor. Sending pt information again.

## 2019-04-26 NOTE — Telephone Encounter (Signed)
Rx(s) sent to pharmacy electronically. Dose increased per 03/20/2019 phone note

## 2019-04-26 NOTE — Telephone Encounter (Signed)
Ok to refill 

## 2019-04-26 NOTE — Telephone Encounter (Signed)
Did he get his Alive Cor.

## 2019-05-10 ENCOUNTER — Other Ambulatory Visit: Payer: Self-pay

## 2019-05-10 ENCOUNTER — Ambulatory Visit (INDEPENDENT_AMBULATORY_CARE_PROVIDER_SITE_OTHER)
Admission: RE | Admit: 2019-05-10 | Discharge: 2019-05-10 | Disposition: A | Discharge status: Home / Self Care | Payer: Self-pay | Source: Ambulatory Visit | Attending: Cardiology | Admitting: Cardiology

## 2019-05-10 DIAGNOSIS — R072 Precordial pain: Secondary | ICD-10-CM

## 2019-05-11 ENCOUNTER — Telehealth: Payer: Self-pay | Admitting: *Deleted

## 2019-05-11 ENCOUNTER — Telehealth: Payer: Self-pay | Admitting: Neurology

## 2019-05-11 MED ORDER — CYCLOBENZAPRINE HCL 5 MG PO TABS: 5.0000 mg | ORAL_TABLET | Freq: Every day | ORAL | 1 refills | Status: DC

## 2019-05-11 NOTE — Telephone Encounter (Signed)
I spoke with patient this morning regarding rescheduling his 12/10 appointment. Patient brought up the concern of pain he has been having involving the disks in his back and neck and explains that it has been difficult for him to sleep/lie down because of this. Patient is inquiring if Dr. Felecia Shelling would recommend/call in muscle relaxers for patient to take before bed to help with pain. Patient advises that the only medication he is currently taking is 5 mg of Amlodipine for blood pressure.

## 2019-05-11 NOTE — Telephone Encounter (Signed)
I reached out to the pt and he was in agreement to this medication. Rx for cyclobenzaprine 5 mg qhs [facility]

## 2019-05-11 NOTE — Addendum Note (Signed)
Addended by: Verlin Grills T on: 05/11/2019 02:57 PM   Modules accepted: Orders

## 2019-05-11 NOTE — Telephone Encounter (Signed)
We can send in cyclobenzaprine 5 mg

## 2019-05-11 NOTE — Telephone Encounter (Signed)
Spoke with patient and gave him Calcium Score results Patient did want for Dr Percival Spanish to know he ordered Jodelle Red but phone not compatible He will be getting updated phone first week of December and will be able to move forward with monitoring.

## 2019-05-26 ENCOUNTER — Telehealth: Payer: Self-pay | Admitting: Cardiology

## 2019-05-26 MED ORDER — CARVEDILOL 3.125 MG PO TABS: 3.1250 mg | ORAL_TABLET | Freq: Two times a day (BID) | ORAL | 3 refills | Status: DC

## 2019-05-26 NOTE — Telephone Encounter (Signed)
Returned call to patient based on MyChart concern for elevated heart rate.  He seems to believe that the amlodipine is causing him to have tachycardia.  Explained that this can occur, but is rare.  He notes systolic pressure better, but diastolic still always > 90.  Will start him on carvedilol 3.125 mg bid and have him come to [facility] for HTN evaluation in 3 week.  Appointment set.    Patient voiced understanding.

## 2019-05-26 NOTE — Telephone Encounter (Signed)
Patient returning Evan Henry's call per MyChart conversation.

## 2019-05-27 ENCOUNTER — Telehealth: Payer: Self-pay | Admitting: Internal Medicine

## 2019-05-27 NOTE — Telephone Encounter (Signed)
Cardiology Moonlighter Note  Returned page from patient. Started new blood pressure medication recently (carvedilol). Most recent BP 162/100 and HR 98. Wants to know whether he should be concerned. Typically SBP's in the 130's to 140's. Patient asymptomatic Has only taken 2 doses of carvedilol.   Recommended patient continue medication at prescribed doses. Continue checking blood pressure daily. Call back if symptomatic or if SBP >180 consistently. Will cc note to patient's cardiologist.   Marcie Mowers, MD Cardiology Fellow, PGY-7

## 2019-05-30 MED ORDER — CARVEDILOL 6.25 MG PO TABS: 6.2500 mg | ORAL_TABLET | Freq: Two times a day (BID) | ORAL | 3 refills | Status: DC

## 2019-05-31 ENCOUNTER — Telehealth: Payer: Self-pay | Admitting: Pharmacist

## 2019-05-31 ENCOUNTER — Encounter: Payer: Self-pay | Admitting: Neurology

## 2019-05-31 MED ORDER — CARVEDILOL 3.125 MG PO TABS: 3.1250 mg | ORAL_TABLET | Freq: Two times a day (BID) | ORAL | 3 refills | Status: DC

## 2019-05-31 NOTE — Telephone Encounter (Signed)
Patient discontinued amlodipine 5 days ago by mistake ; therefore his diastolic BP was elevated.  Changes:  1. Resume amlodipine 5mg  daily  2. Decrease carvedilol back to 3.125mg  twice daily  3. Continue to monitor BP daily. May need further medication titration after 2 weeks on combination therapy.

## 2019-06-01 ENCOUNTER — Other Ambulatory Visit: Payer: Self-pay

## 2019-06-01 ENCOUNTER — Encounter: Payer: Self-pay | Admitting: Neurology

## 2019-06-01 ENCOUNTER — Ambulatory Visit: Payer: 59 | Admitting: Neurology

## 2019-06-01 VITALS — BP 122/84 | HR 96 | Temp 97.5°F | Ht 69.0 in | Wt 186.0 lb

## 2019-06-01 DIAGNOSIS — M791 Myalgia, unspecified site: Secondary | ICD-10-CM | POA: Diagnosis not present

## 2019-06-01 DIAGNOSIS — R253 Fasciculation: Secondary | ICD-10-CM | POA: Diagnosis not present

## 2019-06-01 DIAGNOSIS — G4733 Obstructive sleep apnea (adult) (pediatric): Secondary | ICD-10-CM

## 2019-06-01 DIAGNOSIS — R0781 Pleurodynia: Secondary | ICD-10-CM | POA: Diagnosis not present

## 2019-06-01 DIAGNOSIS — Z9989 Dependence on other enabling machines and devices: Secondary | ICD-10-CM

## 2019-06-01 MED ORDER — CYCLOBENZAPRINE HCL 5 MG PO TABS: 5.0000 mg | ORAL_TABLET | Freq: Three times a day (TID) | ORAL | 5 refills | Status: DC

## 2019-06-01 MED ORDER — CYCLOBENZAPRINE HCL 5 MG PO TABS: 5.0000 mg | ORAL_TABLET | Freq: Every day | ORAL | 5 refills | Status: DC

## 2019-06-01 NOTE — Progress Notes (Signed)
[facility]  PATIENT: Evan Henry DOB: 02/16/1971  REFERRING DOCTOR OR PCP:  Sinda Du SOURCE: patient, notes from PCP, imaging reports and personal review of images on PACS.  _________________________________   HISTORICAL  CHIEF COMPLAINT:  Chief Complaint  Patient presents with  . Follow-up    RM 13, alone. Last seen 07/28/2018. DME: [employer]. No issues. Has full face mask, a little uncomfortable.     HISTORY OF PRESENT ILLNESS:  Evan Henry is a 48 y.o. man with thoracic back pain and multiple other neurologic issues.     Update 06/01/2019: He is noting twitching in muscles of the legs, arms and abdomen.    He feels his legs are slightly weaker.  He has not noticed muscle fatigue.   No change in weight.   He has mild muscle pain in the back of the legs and buttock.   He has pain in the left lateral hip/buttock as well.  He had a more severe left chest pain when he moved suddenly.  Pain worsened laying on his right side and improved laying on his left side (though that worsened his hip pain).   Pain was bad for several days in the chest.    Flexeril was called in and that helped the chest pain but not other pains.     He had a mild shortness of breath when pain was more severe a few days.     He has OSA and is on CPAP 5-20.   Average Pressure was 7-8.      Download showed AHI = 0.8 and compliance was 100% > 4 hours a night.    He feels excessive daytime sleepiness is better on CPAP.        07/28/18: At the last visit, gabapentin and Cymbalta were prescribed for his pain and depression.  He continues to report spasms in his legs.    Initially he felt his pain was doing better but the last few months, pain has been worse.   Pain is most intense in the thoracic spine region and worse with movement.   He continues to have spells of numbness in arms and legs.    Sometimes the face is numb and he once woke up with more intense numbness bilaterally in  lips, nose, tongue and forehead.   He notes mild dizziness at times.   He saw Dermatology for his thoracic itching sensation and was told skin was fine.      He continues to note anxiety > depression.  He never started Cymbalta.    He feels off balanced at times.    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      Sometimes the left leg buckles.     I personally reviewed the MRI brain 10/18/2017 showing a left lateral intraventricular cyst (stable compared to CT form 2009).  It was otherwise normal.     MRI cervical spine showed multilevel DJD with C3C4 and C4C5 mild spinal stenosis and moderate right foraminal narrowing at C5C6 that could affect the right C6 nerve root.     From 10/08/17: He reports a workplace injury 01/06/2017 when he tried to catch a 5 gallon water jug that was falling at work.  His claim has been settled.    Since then, he has had burning pain in the mid back, numbness in his legs and spasms in his legs, left worse than right.  He also has noted pain under his arms.  Initia;lly he had pain that radiated from the mid thoracic spine to the left chest.   He did some physical therapy but felt that the pain and spasms worsened.  He gets some benefit from hydrocodone prescribed by Dr. Arnoldo Morale.  He also has benefited from resting more and activity restriction.   The back and leg spasms worsen with caffeine and he feels that they are a little better since restricting his caffeine intake.  He also reports other pain in the neck and lower back.   He has a h/o neck pain and disc protrusion at C5C6 was noted on 2009 MRI.  I also looked at the images and he has mild spinal stenosis at C5-C6.    He also has LBP and was told one leg is slightly shorter than the other leg and a heel lift was prescribed.     Besides the pain related issues, he also has noted more sleepiness, memory difficulty, emotional lability and anxiety/depression.      He feels focus and attention are reduced.  He falls  asleep easily most nights but wakes up 3-4 times most nights due to either need to urinate or gasping for air many nights.  He snores and has daytime sleepiness.    He is Epworth sleepiness score is consistent with mild excessive daytime sleepiness (12/24).   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    Multiple MRI's were reviewed.  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  It was stable between 03/04/2006 MRI and 01/14/2007 CT scan.     MRI of the thoracic spine was personally reviewed.  It shows disc protrusions centrally at  T5-T6, centrally at T6-7, to the left at T7-T8, left paramedian at T8-T9 and right paramedian T9-T10.    I personally reviewed the 04/10/2008 images of the cervical spine showing cervical spine disc protrusion at C5C6 right paramedian with mild central canal stenosis and mild right foraminal narrowing.    EPWORTH SLEEPINESS SCALE  On a scale of 0 - 3 what is the chance of dozing:  Sitting and Reading:   3 Watching TV:    3 Sitting inactive in a public place: 0 Passenger in car for one hour: 0 Lying down to rest in the afternoon: 3 Sitting and talking to someone: 0 Sitting quietly after lunch:  3 In a car, stopped in traffic:  0  Total (out of 24):  12/24     REVIEW OF SYSTEMS: Constitutional: No fevers, chills, sweats, or change in appetite.   Notes insomnia and excessive daytime sleepiness Eyes: No visual changes, double vision, eye pain Ear, nose and throat: No hearing loss, ear pain, nasal congestion, sore throat Cardiovascular: No chest pain, palpitations Respiratory: No shortness of breath at rest or with exertion.  He snores and occasionally wakes up gasping for air GastrointestinaI: No nausea, vomiting, diarrhea, abdominal pain, fecal incontinence Genitourinary: No dysuria, urinary retention or  frequency.  He has nocturia averaging 3 times a night  Musculoskeletal: No neck pain, back pain Integumentary: No rash, pruritus, skin lesions Neurological: as above Psychiatric: Notes depression and anxiety. Endocrine: No palpitations, diaphoresis, change in appetite, change in weigh or increased thirst Hematologic/Lymphatic: No anemia, purpura, petechiae. Allergic/Immunologic: No itchy/runny eyes, nasal congestion, recent allergic reactions, rashes  ALLERGIES: Allergies  Allergen Reactions  . Ciprofloxacin Other (See Comments)    Joint pain   .  Levofloxacin Other (See Comments)    Joint pain   . Meloxicam Nausea Only  . Sulfa Antibiotics Nausea And Vomiting and Rash    HOME MEDICATIONS:  Current Outpatient Medications:  .  amLODipine (NORVASC) 5 MG tablet, Take 1 tablet (5 mg total) by mouth daily., Disp: 90 tablet, Rfl: 3 .  carvedilol (COREG) 3.125 MG tablet, Take 1 tablet (3.125 mg total) by mouth 2 (two) times daily., Disp: 60 tablet, Rfl: 3 .  cyclobenzaprine (FLEXERIL) 5 MG tablet, Take 1 tablet (5 mg total) by mouth 3 (three) times daily., Disp: 90 tablet, Rfl: 5  PAST MEDICAL HISTORY: Past Medical History:  Diagnosis Date  . Acute bronchitis 12/21/2009   Qualifier: History of  By: Annamaria Boots MD, Clinton D   . ALLERGIC RHINITIS 12/25/2009   Qualifier: Diagnosis of  By: Annamaria Boots MD, Clinton D   . Cervical herniated disc 07/28/2017   Overview:  [facility] - Dr. Collier Salina (retired)  . 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 Depression with anxiety 10/08/2017  . Excessive daytime sleepiness 10/08/2017  . GERD (gastroesophageal reflux disease) 08/17/2012  . 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 Ultrasound/cla  . Leg length discrepancy 07/28/2017  . Paresthesia 07/28/2018  . Snoring 10/08/2017  . 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 Earlean Shawl - q 5 years colonoscopy. Last 2013/cla  . Varicocele 04/19/2013   Overview:  Dr. Karsten Ro    PAST SURGICAL HISTORY: Past Surgical History:  Procedure Laterality Date  . WISDOM TOOTH EXTRACTION      FAMILY HISTORY: Family History  Problem Relation Age of Onset  . Emphysema Mother   . Heart attack Father 59  . Heart attack Brother 42  . Stroke Brother     SOCIAL HISTORY:  Social History   Socioeconomic History  . Marital status: Married    Spouse name: Not on file  . Number of children: Not on file  . Years of education: Not on file  . Highest education level: Not on file  Occupational History  . Not on file  Social Needs  . Financial resource strain: Not on file  . Food insecurity    Worry: Not on file    Inability: Not on file  . Transportation needs    Medical: Not on file    Non-medical: Not on file  Tobacco Use  . Smoking status: Never Smoker  . Smokeless tobacco: Never Used  Substance and Sexual Activity  . Alcohol use: Yes    Comment: occ  . Drug use: No  . Sexual activity: Not on file  Lifestyle  . Physical activity    Days per week: Not on file    Minutes per session: Not on file  . Stress: Not on file  Relationships  . Social Herbalist on phone: Not on file    Gets together: Not on file    Attends religious service: Not on file    Active member of club or organization: Not on file    Attends meetings of clubs or organizations: Not on file    Relationship status: Not on file  . Intimate partner violence    Fear of current or ex partner: Not on file    Emotionally abused: Not on file    Physically abused: Not on file  Forced sexual activity: Not on file  Other Topics Concern  . Not on file  Social History Narrative   Lives with wife.  Sales ceiling fans.      PHYSICAL EXAM  Vitals:   06/01/19 1057  BP: 122/84  Pulse: 96  Temp: (!) 97.5 F (36.4 C)  Weight: 186  lb (84.4 kg)  Height: 5\' 9"  (1.753 m)    Body mass index is 27.47 kg/m.   General: The patient is well-developed and well-nourished and in no acute distress  Skin/musculoskeletal: Extremities are without rash or edema.  He has tenderness over the chest around the fourth rib space on the left.  He also has some tenderness in paraspinal muscles and left piriformis.  Neurologic Exam  Mental status: The patient is alert and oriented x 3 at the time of the examination. The patient has apparent normal recent and remote memory, with an apparently normal attention span and concentration ability.   Speech is normal.  Cranial nerves: Extraocular movements are full.  Facial strength and sensation is normal.  Trapezius strength normal.  The tongue is midline, and the patient has symmetric elevation of the soft palate. No obvious hearing deficits are noted.  Motor:  Muscle bulk is normal.   Tone is normal. Strength is  5 / 5 in all 4 extremities.   Sensory: Intact to touch and vibration.  Coordination: Cerebellar testing reveals good finger-nose-finger and heel-to-shin bilaterally.  Gait and station: Station is normal.   Gait and tandem gait are normal.  Romberg is negative.  Reflexes: Deep tendon reflexes are symmetric and normal in the arms but increased at the knees with spread.  DTRs were normal at the ankles and there was no clonus.       DIAGNOSTIC DATA (LABS, IMAGING, TESTING) - I reviewed patient records, labs, notes, testing and imaging myself where available.  Lab Results  Component Value Date   WBC 8.1 01/07/2019   HGB 14.4 01/07/2019   HCT 42.8 01/07/2019   MCV 85.8 01/07/2019   PLT 286 01/07/2019      Component Value Date/Time   NA 138 01/07/2019 0440   K 3.8 01/07/2019 0440   CL 102 01/07/2019 0440   CO2 26 01/07/2019 0440   GLUCOSE 112 (H) 01/07/2019 0440   BUN 12 01/07/2019 0440   CREATININE 0.99 01/07/2019 0440   CALCIUM 8.7 (L) 01/07/2019 0440   PROT 7.1  07/30/2007 1715   ALBUMIN 4.2 07/30/2007 1715   AST 17 07/30/2007 1715   ALT 21 07/30/2007 1715   ALKPHOS 59 07/30/2007 1715   BILITOT 0.9 07/30/2007 1715   GFRNONAA >60 01/07/2019 0440   GFRAA >60 01/07/2019 0440       ASSESSMENT AND PLAN  Myalgia - Plan: NCV with EMG(electromyography)  Fasciculations - Plan: NCV with EMG(electromyography)  OSA on CPAP  Rib pain on left side   1.   Due to more muscle pain we will increase the Flexeril from nightly to 3 times daily. 2.  He is experiencing fasciculations we will check an EMG to characterize.  Most likely this represents benign fasciculations but he notes a little bit of weakness and the EMG will allow [place] to rule out ALS 3.   Continue Auto-PAP 5 to 20 cm H2O.  Download shows excellent efficacy and compliance.  He has improved somnolence 4.  I will see him when he returns for the NCV/EMG study and further follow-up will be arranged based on the results. Nanine Means.  Felecia Shelling, MD, [facility] [id number], [id number] PM Certified in Neurology, Clinical Neurophysiology, Sleep Medicine, Pain Medicine and Neuroimaging  [facility] [address], [place], [place] [address] [phone]

## 2019-06-02 ENCOUNTER — Ambulatory Visit: Payer: Self-pay | Admitting: Neurology

## 2019-06-10 ENCOUNTER — Telehealth: Payer: Self-pay

## 2019-06-10 NOTE — Telephone Encounter (Signed)
OK to increase to 7.5 mg Norvasc.  Needs office or virtual appt to follow up and to make further adjustments after he has been on this increased dose for a week or so.

## 2019-06-10 NOTE — Telephone Encounter (Signed)
06/09/2019 10:20 PM MYCHART MESSAGE:  Vicente Masson, MD 15 hours ago (10:20 PM)  BA Blood pressure reading 10:15 PM sitting at rest for an hour. 153/102  pulse 84  Taking both blood pressure medications as prescribed.

## 2019-06-13 MED ORDER — AMLODIPINE BESYLATE 5 MG PO TABS: 7.5000 mg | ORAL_TABLET | Freq: Every day | ORAL | 3 refills | Status: DC

## 2019-06-13 NOTE — Telephone Encounter (Signed)
Attempted call to pt. No answer. DPR on file. Left detailed message regarding the following:        OK to increase to 7.5 mg Norvasc.  Needs office or virtual appt to follow up and to make further adjustments after he has been on this increased dose for a week or so.          Advised pt to call back with any questions/concerns and stated Rx for Norvasc 7.5 mg to be be sent to pharmacy on file.   Message routed to scheduling for appt set up

## 2019-06-15 ENCOUNTER — Telehealth: Payer: Self-pay | Admitting: *Deleted

## 2019-06-15 NOTE — Telephone Encounter (Signed)
Created in error

## 2019-06-15 NOTE — Telephone Encounter (Signed)
Left message for patient to call and schedule 1-2 week f/u with Dr. Percival Spanish

## 2019-06-16 ENCOUNTER — Ambulatory Visit (INDEPENDENT_AMBULATORY_CARE_PROVIDER_SITE_OTHER): Payer: 59 | Admitting: Pharmacist Clinician (PhC)/ Clinical Pharmacy Specialist

## 2019-06-16 ENCOUNTER — Other Ambulatory Visit: Payer: Self-pay

## 2019-06-16 VITALS — BP 138/90 | HR 80 | Wt 187.6 lb

## 2019-06-16 DIAGNOSIS — I10 Essential (primary) hypertension: Secondary | ICD-10-CM | POA: Diagnosis not present

## 2019-06-16 MED ORDER — VALSARTAN 160 MG PO TABS: 160.0000 mg | ORAL_TABLET | Freq: Every day | ORAL | 1 refills | Status: DC

## 2019-06-16 NOTE — Progress Notes (Signed)
06/16/2019 Marshell Garfinkel 1971-02-18 [id number]   HPI:  Evan Henry is a 48 y.o. male patient of Dr Percival Spanish, with a PMH below who presents today for hypertension clinic evaluation.  He originally saw Dr. Percival Spanish in September for cardiac evaluation.   In July he had an episode of hypotension, for which he went to the ED.  He notes that since then his BP has been steadily climbing.  At the September visit his pressure was 136/92.  He was prescribed chlorthalidone 12.5 mg, however he never took, as he was concerned about possible dehydration.  He was instead given amlodipine 2.5 mg qd.  A recent Coronary Calcium test showed a score of 0.   Over the past two months this dose was increased to 5 then 7.5 then back to 5.  Carvedilol was added at 3.125 mg, then increased to 6.25, then back to 3.125.  Much of these changes was due to patient not taking meds as we assumed.  He is here today for hypertension evaluation.    He feels well overall today, although he believes that the amlodipine makes him breathe "more", also believes that his heart rate has been more elevated since starting this.  States that previously his heart rate was always in the 70's, now more high 80-90's.  He also bought a Kardia EKG and has been playing with that on his phone and watch.     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); 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  Home BP readings: about a year old Omron cuff -  No numbers with him today, recalls highest systolic around [id number], diastolic to [id number]  Intolerances:  Quinolones, meloxicam, sulfa  Labs: 12/2018:  Na 138, K 3.8, Glu 112, BUN 12, SCr 0.99  Wt Readings from Last 3 Encounters:  06/16/19 187 lb 9.6 oz (85.1 kg)  06/01/19 186 lb (84.4 kg)  03/10/19 181 lb (82.1 kg)   BP Readings from Last 3 Encounters:  06/16/19 138/90  06/01/19 122/84  03/10/19 (!) 136/92   Pulse Readings from Last 3 Encounters:  06/16/19 80  06/01/19 96  03/10/19 81    Current Outpatient Medications  Medication Sig Dispense Refill  . carvedilol (COREG) 3.125 MG tablet Take 1 tablet (3.125 mg total) by mouth 2 (two) times daily. 60 tablet 3  . cyclobenzaprine (FLEXERIL) 5 MG tablet Take 1 tablet (5 mg total) by mouth 3 (three) times daily. 90 tablet 5  . valsartan (DIOVAN) 160 MG tablet Take 1 tablet (160 mg total) by mouth daily. 30 tablet 1   No current facility-administered medications for this visit.    Allergies  Allergen Reactions  . Ciprofloxacin Other (See Comments)    Joint pain   . Levofloxacin Other (See Comments)    Joint pain   . Meloxicam Nausea Only  .  Sulfa Antibiotics Nausea And Vomiting and Rash    Past Medical History:  Diagnosis Date  . Acute bronchitis 12/21/2009   Qualifier: History of  By: Annamaria Boots MD, Clinton D   . ALLERGIC RHINITIS 12/25/2009   Qualifier: Diagnosis of  By: Annamaria Boots MD, Clinton D   . Cervical herniated disc 07/28/2017   Overview:  [facility] - Dr. Collier Salina (retired)  . 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 Depression with anxiety 10/08/2017  . Excessive daytime sleepiness 10/08/2017  . GERD (gastroesophageal reflux disease) 08/17/2012  . 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 Ultrasound/cla  . Leg length discrepancy  07/28/2017  . Paresthesia 07/28/2018  . Snoring 10/08/2017  . 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 Earlean Shawl - q 5 years colonoscopy. Last 2013/cla  . Varicocele 04/19/2013   Overview:  Dr. Karsten Ro    Blood pressure 138/90, pulse 80, weight 187 lb 9.6 oz (85.1 kg).  Hypertension Patient with mostly diastolic hypertension, still poorly controlled.  Discussed the importance of controlling pressure and determining if there is a secondary cause vs just needing higher doses of medications.  Because he is uneasy with the amlodipine, will discontinue that and start him on valsartan 160 mg once daily.  He was given instructions to check home BP no more than twice daily and keep record.  He will return in 3 weeks with log and home cuff.  Explained that if often takes a week or so to see difference with medication.  He will also go to the lab in 2 weeks to check BMET.     Tommy Medal PharmD CPP [facility] [address] [place], [place] [address] [phone]

## 2019-06-16 NOTE — Patient Instructions (Signed)
Return for a a follow up appointment January 19  Go to the lab in 2 weeks to check kidney function and electrolytes (first week of January)  Your blood pressure today is 138/90  Check your blood pressure at home twice daily and keep record of the readings.  Take your BP meds as follows:  Stop amlodipine for now  Start Valsartan 160 mg once daily in the mornings  Bring all of your meds, your BP cuff and your record of home blood pressures to your next appointment.  Exercise as you're able, try to walk approximately 30 minutes per day.  Keep salt intake to a minimum, especially watch canned and prepared boxed foods.  Eat more fresh fruits and vegetables and fewer canned items.  Avoid eating in fast food restaurants.    HOW TO TAKE YOUR BLOOD PRESSURE: . Rest 5 minutes before taking your blood pressure. .  Don't smoke or drink caffeinated beverages for at least 30 minutes before. . Take your blood pressure before (not after) you eat. . Sit comfortably with your back supported and both feet on the floor (don't cross your legs). . Elevate your arm to heart level on a table or a desk. . Use the proper sized cuff. It should fit smoothly and snugly around your bare upper arm. There should be enough room to slip a fingertip under the cuff. The bottom edge of the cuff should be 1 inch above the crease of the elbow. . Ideally, take 3 measurements at one sitting and record the average.

## 2019-06-16 NOTE — Assessment & Plan Note (Signed)
Patient with mostly diastolic hypertension, still poorly controlled.  Discussed the importance of controlling pressure and determining if there is a secondary cause vs just needing higher doses of medications.  Because he is uneasy with the amlodipine, will discontinue that and start him on valsartan 160 mg once daily.  He was given instructions to check home BP no more than twice daily and keep record.  He will return in 3 weeks with log and home cuff.  Explained that if often takes a week or so to see difference with medication.  He will also go to the lab in 2 weeks to check BMET.

## 2019-06-23 NOTE — Telephone Encounter (Signed)
Contacted pt. Pt saw pharmD 12/24 in hypertension clinic. Pt confirmed that he is aware of the following from 12/24 OV note:  "Return for a a follow up appointment January 19  Go to the lab in 2 weeks to check kidney function and electrolytes (first week of January)"

## 2019-06-28 ENCOUNTER — Ambulatory Visit: Payer: 59 | Admitting: Neurology

## 2019-06-28 ENCOUNTER — Encounter: Payer: Self-pay | Admitting: Neurology

## 2019-06-28 ENCOUNTER — Ambulatory Visit (INDEPENDENT_AMBULATORY_CARE_PROVIDER_SITE_OTHER): Payer: 59 | Admitting: Neurology

## 2019-06-28 ENCOUNTER — Other Ambulatory Visit: Payer: Self-pay

## 2019-06-28 DIAGNOSIS — R253 Fasciculation: Secondary | ICD-10-CM

## 2019-06-28 DIAGNOSIS — R202 Paresthesia of skin: Secondary | ICD-10-CM

## 2019-06-28 DIAGNOSIS — M791 Myalgia, unspecified site: Secondary | ICD-10-CM

## 2019-06-28 DIAGNOSIS — R0781 Pleurodynia: Secondary | ICD-10-CM

## 2019-06-28 NOTE — Progress Notes (Signed)
Full Name: Evan Henry Gender: Male MRN #: [id number] Date of Birth: Dec 24, 1970    Visit Date: 06/28/2019 07:38 Age: 49 Years Examining Physician: Arlice Colt, MD  Referring Physician: Arlice Colt, MD    History: Evan Henry is a 49 year old man with myalgias in the back and legs and fasciculations in the legs, right greater than left.  This mostly involves the gastrocnemius muscles but also involves the tibialis anterior and vastus medialis.  He notes slight weakness and muscle fatigue.  On exam, he had normal strength and reduced sensation in the L5 (versus superficial peroneal) dermatome of both feet.  Nerve conduction studies: Bilateral peroneal motor responses had normal distal latencies and conduction velocities but mildly reduced amplitudes.  Bilateral tibial responses and right median and ulnar motor responses had normal distal latencies, amplitudes and conduction velocities.  The right ulnar and tibial F-wave latencies were normal.  The right sural, superficial peroneal, median and ulnar sensory responses had normal peak latencies and amplitudes.  Electromyography: Needle EMG of selected muscles of both legs was performed.  The right gastrocnemius and abductor hallucis muscles showed some polyphasic motor units with fairly normal recruitment.  Other muscles tested had normal motor unit action potentials and recruitment.  There was no abnormal spontaneous activity in any of the muscles tested.   Impression: This NCV/EMG study shows the following: 1.   Possible mild right S1 chronic radiculopathy without active features. 2.   Bilateral peroneal motor responses had reduced amplitudes though there was no evidence of peroneal neuropathy on EMG testing.  This is likely an incidental finding. 3.   There is no evidence of motor neuron disease or myopathy.   Evan Henry A. Evan Shelling, MD, PhD, FAAN Certified in Neurology, Clinical Neurophysiology, Sleep Medicine, Pain Medicine and  Neuroimaging Director, [facility] [address] [place], [place] [address] [phone]         [facility]    Nerve / Sites Muscle Latency Ref. Amplitude Ref. Rel Amp Segments Distance Velocity Ref. Area    ms ms mV mV %  cm m/s m/s mVms  R Median - APB     Wrist APB 3.1 ?4.4 10.9 ?4.0 100 Wrist - APB 7   42.1     Upper arm APB 7.2  10.9  99.8 Upper arm - Wrist 23 56 ?49 41.6  R Ulnar - ADM     Wrist ADM 2.4 ?3.3 10.6 ?6.0 100 Wrist - ADM 7   30.0     B.Elbow ADM 5.7  10.3  97.3 B.Elbow - Wrist 20 60 ?49 29.7     A.Elbow ADM 7.4  10.0  97.1 A.Elbow - B.Elbow 10 60 ?49 28.9         A.Elbow - Wrist      R Peroneal - EDB     Ankle EDB 5.4 ?6.5 1.3 ?2.0 100 Ankle - EDB 9   4.7     Fib head EDB 11.0  1.0  80.6 Fib head - Ankle 28 50 ?44 3.7     Pop fossa EDB 13.2  1.1  103 Pop fossa - Fib head 10 45 ?44 4.2         Pop fossa - Ankle      L Peroneal - EDB     Ankle EDB 6.2 ?6.5 1.6 ?2.0 100 Ankle - EDB 9   7.3     Fib head EDB  12.5  1.4  87.1 Fib head - Ankle 28 45 ?44 8.5     Pop fossa EDB 14.6  1.3  97.3 Pop fossa - Fib head 10 46 ?44 8.6         Pop fossa - Ankle 0 0    R Tibial - AH     Ankle AH 3.9 ?5.8 15.8 ?4.0 100 Ankle - AH 9   36.1     Pop fossa AH 12.7  11.5  72.4 Pop fossa - Ankle 39 44 ?41 31.3                SNC    Nerve / Sites Rec. Site Peak Lat Ref.  Amp Ref. Segments Distance    ms ms V V  cm  R Sural - Ankle (Calf)     Calf Ankle 3.5 ?4.4 14 ?6 Calf - Ankle 14  R Superficial peroneal - Ankle     Lat leg Ankle 4.0 ?4.4 12 ?6 Lat leg - Ankle 14  R Median - Orthodromic (Dig II, Mid palm)     Dig II Wrist 2.9 ?3.4 15 ?10 Dig II - Wrist 13  R Ulnar - Orthodromic, (Dig V, Mid palm)     Dig V Wrist 2.6 ?3.1 8 ?5 Dig V - Wrist 65             F  Wave    Nerve F Lat Ref.   ms ms  R Tibial - AH 49.3 ?56.0  R Ulnar - ADM 25.8 ?32.0         EMG Summary Table    Spontaneous  MUAP Recruitment  Muscle IA Fib PSW Fasc Other Amp Dur. Poly Pattern  R. Vastus medialis Normal None None None _______ Normal Normal Normal Normal  R. Tibialis anterior Normal None None None _______ Normal Normal Normal Normal  R. Peroneus longus Normal None None None _______ Normal Normal Normal Normal  R. Gastrocnemius (Medial head) Normal None None None _______ Normal Increased 1+ Normal  R. Abductor hallucis Normal None None None _______ Normal Normal 1+ Normal  R. Gluteus medius Normal None None None _______ Normal Normal Normal Normal  R. Iliopsoas Normal None None None _______ Normal Normal Normal Normal  L. Vastus medialis Normal None None None _______ Normal Normal Normal Normal  L. Tibialis anterior Normal None None None _______ Normal Normal Normal Normal  L. Peroneus longus Normal None None None _______ Normal Normal Normal Normal  L. Gastrocnemius (Medial head) Normal None None None _______ Normal Normal Normal Normal  L. Iliopsoas Normal None None None _______ Normal Normal Normal Normal

## 2019-06-28 NOTE — Progress Notes (Addendum)
[facility]  PATIENT: Evan Henry DOB: Mar 14, 1971  REFERRING DOCTOR OR PCP:  Sinda Du SOURCE: patient, notes from PCP, imaging reports and personal review of images on PACS.  _________________________________   HISTORICAL  CHIEF COMPLAINT:  Chief Complaint  Patient presents with  . Other    Myalgias and muscle twitching    HISTORY OF PRESENT ILLNESS:  Evan Henry is a 49 y.o. man with thoracic back pain and multiple other neurologic issues.    Update 06/28/2019: He continues to note twitching mostly in the muscles of the legs, right greater than left.  Gastrocnemius muscle, tibialis anterior and vastus medialis have sometimes had fasciculations.  He also notes myalgias in the leg muscles.  He notes that the muscles feel a little bit weak at times and very fatigued easily.  Additionally, he gets pain that radiates into the left chest at times.  I personally reviewed the MRI of the thoracic spine dated 07/29/2018.  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  There are multiple degenerative disc changes.  Small disc protrusions are noted at T4-T5 and T5-T6.  At T6-T7, there is a small central disc protrusion that effaces the thecal sac with flattening ventrally.  At T7-T8 there is a left paramedian disc protrusion with mild flattening of the cord on that side.  At T7-T8 there is a left paramedian disc protrusion that also has some cord flattening.  There is a small central disc protrusion at T8-T9 and a right paramedian disc protrusion at T9-T10.  There is some facet hypertrophy at T10-T11.  There does not appear to be significant foraminal narrowing at any of these levels and no nerve root compression.  Of note, the degenerative changes are unchanged compared to his previous MRI from 2018 though that MRI does not clearly show the syrinx.  Today, he had an NCV/EMG study.  The peroneal nerves  bilaterally had reduced amplitudes though the distal latencies and nerve velocities were normal.  The tibial nerves in the right ulnar and median nerves were normal.  Over the sensory nerves tested were normal.  On EMG, there was mild chronic denervation in the right gastrocnemius muscle and other muscles were essentially normal.  There was no abnormal spontaneous activity.  Update 06/01/2019: He is noting twitching in muscles of the legs, arms and abdomen.    He feels his legs are slightly weaker.  He has not noticed muscle fatigue.   No change in weight.   He has mild muscle pain in the back of the legs and buttock.   He has pain in the left lateral hip/buttock as well.  He had a more severe left chest pain when he moved suddenly.  Pain worsened laying on his right side and improved laying on his left side (though that worsened his hip pain).   Pain was bad for several days in the chest.    Flexeril was called in and that helped the chest pain but not other pains.     He had a mild shortness of breath when pain was more severe a few days.     He has OSA and is on CPAP 5-20.   Average Pressure was 7-8.      Download showed AHI = 0.8 and compliance was 100% > 4 hours a night.    He feels excessive daytime sleepiness is better on CPAP.  07/28/18: At the last visit, gabapentin and Cymbalta were prescribed for his pain and depression.  He continues to report spasms in his legs.    Initially he felt his pain was doing better but the last few months, pain has been worse.   Pain is most intense in the thoracic spine region and worse with movement.   He continues to have spells of numbness in arms and legs.    Sometimes the face is numb and he once woke up with more intense numbness bilaterally in lips, nose, tongue and forehead.   He notes mild dizziness at times.   He saw Dermatology for his thoracic itching sensation and was told skin was fine.      He continues to note anxiety > depression.  He never  started Cymbalta.    He feels off balanced at times.    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      Sometimes the left leg buckles.     I personally reviewed the MRI brain 10/18/2017 showing a left lateral intraventricular cyst (stable compared to CT form 2009).  It was otherwise normal.     MRI cervical spine showed multilevel DJD with C3C4 and C4C5 mild spinal stenosis and moderate right foraminal narrowing at C5C6 that could affect the right C6 nerve root.     From 10/08/17: He reports a workplace injury 01/06/2017 when he tried to catch a 5 gallon water jug that was falling at work.  His claim has been settled.    Since then, he has had burning pain in the mid back, numbness in his legs and spasms in his legs, left worse than right.  He also has noted pain under his arms.    Initia;lly he had pain that radiated from the mid thoracic spine to the left chest.   He did some physical therapy but felt that the pain and spasms worsened.  He gets some benefit from hydrocodone prescribed by Dr. Arnoldo Morale.  He also has benefited from resting more and activity restriction.   The back and leg spasms worsen with caffeine and he feels that they are a little better since restricting his caffeine intake.  He also reports other pain in the neck and lower back.   He has a h/o neck pain and disc protrusion at C5C6 was noted on 2009 MRI.  I also looked at the images and he has mild spinal stenosis at C5-C6.    He also has LBP and was told one leg is slightly shorter than the other leg and a heel lift was prescribed.     Besides the pain related issues, he also has noted more sleepiness, memory difficulty, emotional lability and anxiety/depression.      He feels focus and attention are reduced.  He falls asleep easily most nights but wakes up 3-4 times most nights due to either need to urinate or gasping for air many nights.  He snores and has daytime sleepiness.    He is Epworth sleepiness score is consistent with  mild excessive daytime sleepiness (12/24).   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    Multiple MRI's were reviewed.  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  It was stable between 03/04/2006 MRI and 01/14/2007 CT scan.     MRI of the thoracic spine was personally reviewed.  It  shows disc protrusions centrally at  T5-T6, centrally at T6-7, to the left at T7-T8, left paramedian at T8-T9 and right paramedian T9-T10.    I personally reviewed the 04/10/2008 images of the cervical spine showing cervical spine disc protrusion at C5C6 right paramedian with mild central canal stenosis and mild right foraminal narrowing.    EPWORTH SLEEPINESS SCALE  On a scale of 0 - 3 what is the chance of dozing:  Sitting and Reading:   3 Watching TV:    3 Sitting inactive in a public place: 0 Passenger in car for one hour: 0 Lying down to rest in the afternoon: 3 Sitting and talking to someone: 0 Sitting quietly after lunch:  3 In a car, stopped in traffic:  0  Total (out of 24):  12/24     REVIEW OF SYSTEMS: Constitutional: No fevers, chills, sweats, or change in appetite.   Notes insomnia and excessive daytime sleepiness Eyes: No visual changes, double vision, eye pain Ear, nose and throat: No hearing loss, ear pain, nasal congestion, sore throat Cardiovascular: No chest pain, palpitations Respiratory: No shortness of breath at rest or with exertion.  He snores and occasionally wakes up gasping for air GastrointestinaI: No nausea, vomiting, diarrhea, abdominal pain, fecal incontinence Genitourinary: No dysuria, urinary retention or frequency.  He has nocturia averaging 3 times a night  Musculoskeletal: No neck pain, back pain Integumentary: No rash, pruritus, skin lesions Neurological: as above Psychiatric: Notes depression and  anxiety. Endocrine: No palpitations, diaphoresis, change in appetite, change in weigh or increased thirst Hematologic/Lymphatic: No anemia, purpura, petechiae. Allergic/Immunologic: No itchy/runny eyes, nasal congestion, recent allergic reactions, rashes  ALLERGIES: Allergies  Allergen Reactions  . Ciprofloxacin Other (See Comments)    Joint pain   . Levofloxacin Other (See Comments)    Joint pain   . Meloxicam Nausea Only  . Sulfa Antibiotics Nausea And Vomiting and Rash    HOME MEDICATIONS:  Current Outpatient Medications:  .  carvedilol (COREG) 3.125 MG tablet, Take 1 tablet (3.125 mg total) by mouth 2 (two) times daily., Disp: 60 tablet, Rfl: 3 .  cyclobenzaprine (FLEXERIL) 5 MG tablet, Take 1 tablet (5 mg total) by mouth 3 (three) times daily., Disp: 90 tablet, Rfl: 5 .  valsartan (DIOVAN) 160 MG tablet, Take 1 tablet (160 mg total) by mouth daily., Disp: 30 tablet, Rfl: 1  PAST MEDICAL HISTORY: Past Medical History:  Diagnosis Date  . Acute bronchitis 12/21/2009   Qualifier: History of  By: Annamaria Boots MD, Clinton D   . ALLERGIC RHINITIS 12/25/2009   Qualifier: Diagnosis of  By: Annamaria Boots MD, Clinton D   . 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 Collier Salina (retired)  . 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 Depression with anxiety 10/08/2017  . Excessive daytime sleepiness 10/08/2017  . GERD (gastroesophageal reflux disease) 08/17/2012  . 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 Ultrasound/cla  . Leg length discrepancy 07/28/2017  . Paresthesia 07/28/2018  . Snoring 10/08/2017  . 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 Earlean Shawl - q 5 years colonoscopy. Last 2013/cla  . Varicocele 04/19/2013   Overview:  Dr. Karsten Ro    PAST SURGICAL HISTORY: Past Surgical History:   Procedure Laterality Date  . WISDOM TOOTH EXTRACTION      FAMILY HISTORY: Family History  Problem  Relation Age of Onset  . Emphysema Mother   . Heart attack Father 49  . Heart attack Brother 83  . Stroke Brother     SOCIAL HISTORY:  Social History   Socioeconomic History  . Marital status: Married    Spouse name: Not on file  . Number of children: Not on file  . Years of education: Not on file  . Highest education level: Not on file  Occupational History  . Not on file  Tobacco Use  . Smoking status: Never Smoker  . Smokeless tobacco: Never Used  Substance and Sexual Activity  . Alcohol use: Yes    Comment: occ  . Drug use: No  . Sexual activity: Not on file  Other Topics Concern  . Not on file  Social History Narrative   Lives with wife.  Sales ceiling fans.    Social Determinants of Health   Financial Resource Strain:   . Difficulty of Paying Living Expenses: Not on file  Food Insecurity:   . Worried About Charity fundraiser in the Last Year: Not on file  . Ran Out of Food in the Last Year: Not on file  Transportation Needs:   . Lack of Transportation (Medical): Not on file  . Lack of Transportation (Non-Medical): Not on file  Physical Activity:   . Days of Exercise per Week: Not on file  . Minutes of Exercise per Session: Not on file  Stress:   . Feeling of Stress : Not on file  Social Connections:   . Frequency of Communication with Friends and Family: Not on file  . Frequency of Social Gatherings with Friends and Family: Not on file  . Attends Religious Services: Not on file  . Active Member of Clubs or Organizations: Not on file  . Attends Archivist Meetings: Not on file  . Marital Status: Not on file  Intimate Partner Violence:   . Fear of Current or Ex-Partner: Not on file  . Emotionally Abused: Not on file  . Physically Abused: Not on file  . Sexually Abused: Not on file     PHYSICAL EXAM  There were no vitals filed for this  visit.  There is no height or weight on file to calculate BMI.   General: The patient is well-developed and well-nourished and in no acute distress  Skin/musculoskeletal: There are no rashes or edema.  He has mild lumbar tenderness. Neurologic Exam  Mental status: The patient is alert and oriented x 3 at the time of the examination. The patient has apparent normal recent and remote memory, with an apparently normal attention span and concentration ability.   Speech is normal.  Cranial nerves: Extraocular movements are full.  Facial strength is normal.. No obvious hearing deficits are noted.  Motor:  Muscle bulk is normal.   Tone is normal. Strength is  5 / 5 in muscles of the legs.  He can squat and rise and stand on his toes and heels  Sensory: He has intact vibration sensation in the feet.  There is mildly reduced sensation to touch/temperature in the L5/superficial peroneal dermatomal/neurotome.  Coordination: Cerebellar testing reveals good heel-to-shin bilaterally.  Gait and station: Station is normal.   Gait and tandem gait are normal.  Romberg is negative.  Reflexes: Deep tendon reflexes are symmetric and normal in the arms but increased at the knees with mild crossed abductor response.  DTRs were normal at the ankles and there was no clonus.  DIAGNOSTIC DATA (LABS, IMAGING, TESTING) - I reviewed patient records, labs, notes, testing and imaging myself where available.  Lab Results  Component Value Date   WBC 8.1 01/07/2019   HGB 14.4 01/07/2019   HCT 42.8 01/07/2019   MCV 85.8 01/07/2019   PLT 286 01/07/2019      Component Value Date/Time   NA 138 01/07/2019 0440   K 3.8 01/07/2019 0440   CL 102 01/07/2019 0440   CO2 26 01/07/2019 0440   GLUCOSE 112 (H) 01/07/2019 0440   BUN 12 01/07/2019 0440   CREATININE 0.99 01/07/2019 0440   CALCIUM 8.7 (L) 01/07/2019 0440   PROT 7.1 07/30/2007 1715   ALBUMIN 4.2 07/30/2007 1715   AST 17 07/30/2007 1715   ALT 21  07/30/2007 1715   ALKPHOS 59 07/30/2007 1715   BILITOT 0.9 07/30/2007 1715   GFRNONAA >60 01/07/2019 0440   GFRAA >60 01/07/2019 0440       ASSESSMENT AND PLAN  Myalgia  Fasciculations  Rib pain on left side  Paresthesia   1.   His NCV/EMG study did not show any evidence of ALS.  He continues to note some symptoms of the legs, right greater than left and there were changes consistent with a mild right chronic S1 radiculopathy.  However, his sensory exam and nerve conduction findings would be more consistent with L5 radiculopathy.  We will check an MRI of the lumbar spine to determine if he needs intervention or referral for this issue 2.  Stay active and exercise as tolerated. 3.   The pain in the left chest could be coming from the degenerative changes in the mid to lower thoracic spine.  Continue medications. 4.   He will return to see me as needed based on the results of the imaging studies.  He is advised to call if he has further or worsening neurologic symptoms.  Argentina Kosch A. Felecia Shelling, MD, [facility] [id number], [id number] Certified in Neurology, Clinical Neurophysiology, Sleep Medicine, Pain Medicine and Neuroimaging  [facility] [address] [place], [place] [address] [phone]

## 2019-06-30 LAB — BASIC METABOLIC PANEL
BUN/Creatinine Ratio: 15 (ref 9–20)
BUN: 12 mg/dL (ref 6–24)
CO2: 23 mmol/L (ref 20–29)
Calcium: 9.1 mg/dL (ref 8.7–10.2)
Chloride: 100 mmol/L (ref 96–106)
Creatinine, Ser: 0.78 mg/dL (ref 0.76–1.27)
GFR calc Af Amer: 124 mL/min/{1.73_m2} (ref >59)
GFR calc non Af Amer: 107 mL/min/{1.73_m2} (ref >59)
Glucose: 87 mg/dL (ref 65–99)
Potassium: 4.4 mmol/L (ref 3.5–5.2)
Sodium: 136 mmol/L (ref 134–144)

## 2019-07-05 ENCOUNTER — Telehealth: Payer: Self-pay | Admitting: Neurology

## 2019-07-05 MED ORDER — ETODOLAC 400 MG PO TABS: 400.0000 mg | ORAL_TABLET | Freq: Two times a day (BID) | ORAL | 3 refills | Status: DC

## 2019-07-05 MED ORDER — METHOCARBAMOL 750 MG PO TABS: 750.0000 mg | ORAL_TABLET | Freq: Two times a day (BID) | ORAL | 5 refills | Status: DC | PRN

## 2019-07-05 NOTE — Telephone Encounter (Signed)
Pt has called to report a bad flare up and would like to know if Dr Felecia Shelling is willing to have something called into [facility] #[phone]

## 2019-07-05 NOTE — Telephone Encounter (Addendum)
Called pt back and relayed Dr. Garth Bigness recommendation. He cannot take meloxicam, had stomach upset from this in the past. He also is requesting short term supply of pain medication. I placed him on hold. Dr Felecia Shelling recommends etodolac 400mg  po BID #60, 3 refills instead. Pt agreeable to this. Advised Dr. Felecia Shelling does not recommend pain medication, he would like him to try methocarbamol and etodolac first. Pt verbalized understanding.

## 2019-07-05 NOTE — Telephone Encounter (Signed)
Lets try another muscle relaxant (ok for him to take Flexeril at night still if it helps sleep) and an NSAID  Methocarbamol 750 mg po bid  #30  #5 Meloxicam 15 mg po qd  #30  #5

## 2019-07-05 NOTE — Telephone Encounter (Addendum)
I called pt back. He is having a lot of pain in neck/back. Muscle spasms in shoulder, biceps, back and chest. Sx started 06/28/19 when he came for EMG/NCS but did not mention anything then because he wanted to concentrate on having test completed. Sx have progressed since then. He is not able to sleep, only got 3 hr of sleep last night. He has hx cervical stenosis. He previously tried taking flexeril for muscle spasms in legs but felt weak taking this so he had stopped this but ended up taking one dose last night of the 5mg  tablet to help with spasms. He plans to take another dose tonight. He wants to know what Dr. Felecia Shelling recommends. Advised I will discuss with him and call back.

## 2019-07-05 NOTE — Addendum Note (Signed)
Addended by: Hope Pigeon on: 07/05/2019 02:13 PM   Modules accepted: Orders

## 2019-07-08 ENCOUNTER — Other Ambulatory Visit: Payer: Self-pay | Admitting: Cardiology

## 2019-07-11 ENCOUNTER — Telehealth: Payer: Self-pay | Admitting: Pharmacist Clinician (PhC)/ Clinical Pharmacy Specialist

## 2019-07-11 ENCOUNTER — Other Ambulatory Visit: Payer: Self-pay | Admitting: *Deleted

## 2019-07-11 NOTE — Telephone Encounter (Signed)
Patient had f/u appt with HTN on 07/11/2018, but cancelled his appointment. Currently treating back and neck pain.  Recommendation:  - Follow recommendation for pain management, no need to increase valsartan yet.  - May take and extra 1/2 tablet of valsartan daily if BP above [id number] systolic.  - Keep follow up with HTN clinic on Feb/09/2019

## 2019-07-11 NOTE — Progress Notes (Deleted)
PATIENT: Evan Henry DOB: [birth year]  REASON FOR VISIT: follow up HISTORY FROM: patient  HISTORY OF PRESENT ILLNESS: Today 07/11/19  HISTORY   REVIEW OF SYSTEMS: Out of a complete 14 system review of symptoms, the patient complains only of the following symptoms, and all other reviewed systems are negative.  ALLERGIES: Allergies  Allergen Reactions  . Ciprofloxacin Other (See Comments)    Joint pain   . Levofloxacin Other (See Comments)    Joint pain   . Meloxicam Nausea Only  . Sulfa Antibiotics Nausea And Vomiting and Rash    HOME MEDICATIONS: Outpatient Medications Prior to Visit  Medication Sig Dispense Refill  . carvedilol (COREG) 3.125 MG tablet Take 1 tablet (3.125 mg total) by mouth 2 (two) times daily. 60 tablet 3  . cyclobenzaprine (FLEXERIL) 5 MG tablet Take 1 tablet (5 mg total) by mouth 3 (three) times daily. (Patient taking differently: Take 5 mg by mouth at bedtime. ) 90 tablet 5  . etodolac (LODINE) 400 MG tablet Take 1 tablet (400 mg total) by mouth 2 (two) times daily. 60 tablet 3  . methocarbamol (ROBAXIN) 750 MG tablet Take 1 tablet (750 mg total) by mouth 2 (two) times daily as needed for muscle spasms. 30 tablet 5  . valsartan (DIOVAN) 160 MG tablet TAKE 1 TABLET BY MOUTH EVERY DAY 30 tablet 8   No facility-administered medications prior to visit.    PAST MEDICAL HISTORY: Past Medical History:  Diagnosis Date  . Acute bronchitis 12/21/2009   Qualifier: History of  By: Annamaria Boots MD, Clinton D   . ALLERGIC RHINITIS 12/25/2009   Qualifier: Diagnosis of  By: Annamaria Boots MD, Clinton D   . Cervical herniated disc 07/28/2017   Overview:  [facility] - Dr. Collier Salina (retired)  . 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 Depression with anxiety 10/08/2017  . Excessive daytime sleepiness 10/08/2017  . GERD (gastroesophageal reflux disease) 08/17/2012  .  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 Ultrasound/cla  . Leg length discrepancy 07/28/2017  . Paresthesia 07/28/2018  . Snoring 10/08/2017  . 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 Earlean Shawl - q 5 years colonoscopy. Last 2013/cla  . Varicocele 04/19/2013   Overview:  Dr. Karsten Ro    PAST SURGICAL HISTORY: Past Surgical History:  Procedure Laterality Date  . WISDOM TOOTH EXTRACTION      FAMILY HISTORY: Family History  Problem Relation Age of Onset  . Emphysema Mother   . Heart attack Father 13  . Heart attack Brother 69  . Stroke Brother     SOCIAL HISTORY: Social History   Socioeconomic History  . Marital status: Married    Spouse name: Not on file  . Number of children: Not on file  . Years of education: Not on file  . Highest education level: Not on file  Occupational History  . Not on file  Tobacco Use  . Smoking status: Never Smoker  . Smokeless tobacco: Never Used  Substance and Sexual Activity  . Alcohol use: Yes    Comment: occ  . Drug use: No  . Sexual activity: Not on file  Other Topics Concern  . Not on file  Social History Narrative   Lives with wife.  Sales ceiling fans.    Social Determinants of Health   Financial Resource Strain:   .  Difficulty of Paying Living Expenses: Not on file  Food Insecurity:   . Worried About Charity fundraiser in the Last Year: Not on file  . Ran Out of Food in the Last Year: Not on file  Transportation Needs:   . Lack of Transportation (Medical): Not on file  . Lack of Transportation (Non-Medical): Not on file  Physical Activity:   . Days of Exercise per Week: Not on file  . Minutes of Exercise per Session: Not on file  Stress:   . Feeling of Stress : Not on file  Social Connections:   . Frequency of Communication with Friends and Family: Not on file  . Frequency of Social Gatherings with Friends and Family: Not on file  .  Attends Religious Services: Not on file  . Active Member of Clubs or Organizations: Not on file  . Attends Archivist Meetings: Not on file  . Marital Status: Not on file  Intimate Partner Violence:   . Fear of Current or Ex-Partner: Not on file  . Emotionally Abused: Not on file  . Physically Abused: Not on file  . Sexually Abused: Not on file      PHYSICAL EXAM  There were no vitals filed for this visit. There is no height or weight on file to calculate BMI.  Generalized: Well developed, in no acute distress   Neurological examination  Mentation: Alert oriented to time, place, history taking. Follows all commands speech and language fluent Cranial nerve II-XII: Pupils were equal round reactive to light. Extraocular movements were full, visual field were full on confrontational test. Facial sensation and strength were normal. Uvula tongue midline. Head turning and shoulder shrug  were normal and symmetric. Motor: The motor testing reveals 5 over 5 strength of all 4 extremities. Good symmetric motor tone is noted throughout.  Sensory: Sensory testing is intact to soft touch on all 4 extremities. No evidence of extinction is noted.  Coordination: Cerebellar testing reveals good finger-nose-finger and heel-to-shin bilaterally.  Gait and station: Gait is normal. Tandem gait is normal. Romberg is negative. No drift is seen.  Reflexes: Deep tendon reflexes are symmetric and normal bilaterally.   DIAGNOSTIC DATA (LABS, IMAGING, TESTING) - I reviewed patient records, labs, notes, testing and imaging myself where available.  Lab Results  Component Value Date   WBC 8.1 01/07/2019   HGB 14.4 01/07/2019   HCT 42.8 01/07/2019   MCV 85.8 01/07/2019   PLT 286 01/07/2019      Component Value Date/Time   NA 136 06/30/2019 1052   K 4.4 06/30/2019 1052   CL 100 06/30/2019 1052   CO2 23 06/30/2019 1052   GLUCOSE 87 06/30/2019 1052   GLUCOSE 112 (H) 01/07/2019 0440   BUN 12  06/30/2019 1052   CREATININE 0.78 06/30/2019 1052   CALCIUM 9.1 06/30/2019 1052   PROT 7.1 07/30/2007 1715   ALBUMIN 4.2 07/30/2007 1715   AST 17 07/30/2007 1715   ALT 21 07/30/2007 1715   ALKPHOS 59 07/30/2007 1715   BILITOT 0.9 07/30/2007 1715   GFRNONAA 107 06/30/2019 1052   GFRAA 124 06/30/2019 1052   No results found for: CHOL, HDL, LDLCALC, LDLDIRECT, TRIG, CHOLHDL No results found for: HGBA1C No results found for: VITAMINB12 No results found for: TSH    ASSESSMENT AND PLAN 49 y.o. year old male  has a past medical history of Acute bronchitis (12/21/2009), ALLERGIC RHINITIS (12/25/2009), Cervical herniated disc (07/28/2017), Cyst of spleen (04/18/2010), Depression with anxiety (10/08/2017), Excessive  daytime sleepiness (10/08/2017), GERD (gastroesophageal reflux disease) (08/17/2012), Hepatic hemangioma (04/18/2010), Leg length discrepancy (07/28/2017), Paresthesia (07/28/2018), Snoring (10/08/2017), Tubulovillous adenoma of colon (04/18/2010), and Varicocele (04/19/2013). here with ***   I spent 15 minutes with the patient. 50% of this time was spent   Butler Denmark, [place], DNP 07/11/2019, 12:57 PM [facility] [address], [place], [place] [address] [phone]

## 2019-07-11 NOTE — Telephone Encounter (Signed)
I called pt and he states the same information from 07-05-19 re: pain in L arm, not able to lift beyond his shoulder, Spasms is L shoulder, bicep, L upper back, chest and abdomen. Spasms improved some w/flexeril.  Has not taken the etodolac (has had stomach upset with meloxicam and is wary of taking this. He did not see reason to take robaxin as on flexeril already.  He is asking for another med for pain, (he did call his cardiologist because his bp was up due to pain).  ? Needing exercises for mobility, scan?  Please advise.

## 2019-07-11 NOTE — Telephone Encounter (Signed)
Pt c/o BP issue: STAT if pt c/o blurred vision, one-sided weakness or slurred speech  1. What are your last 5 BP readings? 123/92 Hr 77, 124/88 HR 79,  124/89 HR 71, 107/83 Hr 83, 115/87 Hr 71  2. Are you having any other symptoms (ex. Dizziness, headache, blurred vision, passed out)? Headache   3. What is your BP issue? Patient states his BP is elevated. He says he has spine disc problems and has been having muscle spasms, shoulder pain, pain in upper left back, weakness in left arm. Patient would like to know if his medications should be adjusted.

## 2019-07-12 ENCOUNTER — Ambulatory Visit: Payer: 59 | Admitting: Neurology

## 2019-07-12 ENCOUNTER — Ambulatory Visit: Payer: 59

## 2019-07-13 MED ORDER — TRAMADOL HCL 50 MG PO TABS: 50.0000 mg | ORAL_TABLET | Freq: Three times a day (TID) | ORAL | 0 refills | Status: DC | PRN

## 2019-07-13 MED ORDER — PREDNISONE 10 MG PO TABS: ORAL_TABLET | ORAL | 0 refills | Status: DC

## 2019-07-13 NOTE — Telephone Encounter (Signed)
Since symptoms are worse we can: Send in prescription for tramadol 50 mg #90 no refills  one po tid prn Send in script for 6 day prednisone pack (10 mg #21 no refills) Mri cervical spine "neck pain, arm pain and arm weakness"

## 2019-07-13 NOTE — Telephone Encounter (Signed)
I LMVM for pt that Dr. Felecia Shelling responded with plan of : getting MRI cervical spine, tramadol 50mg  po tid prn #90 , and prednisone dose pack. 6 day taper.  Drug Registry checked last fill hydrocodone 07-12-19 #10 Horald Pollen.

## 2019-07-28 ENCOUNTER — Other Ambulatory Visit: Payer: Self-pay

## 2019-07-28 ENCOUNTER — Ambulatory Visit (INDEPENDENT_AMBULATORY_CARE_PROVIDER_SITE_OTHER): Payer: 59 | Admitting: Pharmacist Clinician (PhC)/ Clinical Pharmacy Specialist

## 2019-07-28 DIAGNOSIS — I10 Essential (primary) hypertension: Secondary | ICD-10-CM | POA: Diagnosis not present

## 2019-07-28 MED ORDER — CARVEDILOL 6.25 MG PO TABS: 6.2500 mg | ORAL_TABLET | Freq: Two times a day (BID) | ORAL | 3 refills | Status: DC

## 2019-07-28 NOTE — Progress Notes (Signed)
07/28/2019 Evan Henry [birth year] [id number]   HPI:  Evan Henry is a 49 y.o. male patient of Dr Percival Spanish, who presents today for hypertension clinic evaluation.  He originally saw Dr. Percival Spanish in September for cardiac evaluation.   In addition to hypertension he has OSA which is treated with CPAP.    In July he had an episode of hypotension, for which he went to the ED.  He notes that since then his BP has been steadily climbing.  At the September visit his pressure was 136/92.  He was prescribed chlorthalidone 12.5 mg, however he never took, as he was concerned about possible dehydration.  He was instead given amlodipine 2.5 mg qd.  A recent Coronary Calcium test showed a score of 0.   His carvedilol and amlodipine doses have been increased and decreased over the past several months, in part due to patient compliance.  At his last visit with me (about 6 weeks ago) I stopped the amlodipine and started him on valsartan 160 mg daily.  Metabolic panel 2 weeks later were WNL.   Today he is here for follow up.   He notes that after starting the valsartan he felt some blurry vision and headaches.  This lasted about 10 days then subsided.  He does also note occasional dizziness, usually associated with hot showers.  Earlier in January he had an episode of shoulder/rotator cuff inflammation.  He was given prednisone taper, etodolac and hydrocodone.  He noted relief after 1 hydrocodone tablet, but has been going to PT to help with strengthening and range of motion.  He did not take either the anti-inflammatory or prednisone.     Blood Pressure Goal:  130/80  Current Medications: valsartan 160 mg, carvedilol 3.125  Family Hx: father hld, MI (late 51's), CAD, htn, - now 22  Mother - no heart issues - now 81  Brother had MI, stroke at 76, still living  One 1/2 brother with pacemaker (dad)  One 1/2 sister with some cardiac issues (dad)  Son 8 - healthy  Social Hx: no tobacco, grew up with  secondhand smoke; only occasional alcohol; coffee (high end, 1 -2 cups per day)  Diet: mostly eating out, daily, fast food ([employer], [employer], [employer]); not much fruits/vegetables; snacks cookies - not too often  - - better with eating out less; more home cooked - dinner daily at home now - 2 veggies nightly  Exercise: 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  Looking at past 10 days of morning and evening readings, AM average 119/87 and PM average 112/80  Intolerances:  Quinolones, meloxicam, sulfa  Labs: 12/2018:  Na 138, K 3.8, Glu 112, BUN 12, SCr 0.99  1/21:  Na 136, K 4.4, Glu 87, BUN 12, SCR 0.78 (on valsartan 160 mg)  Wt Readings from Last 3 Encounters:  07/28/19 190 lb 12.8 oz (86.5 kg)  06/16/19 187 lb 9.6 oz (85.1 kg)  06/01/19 186 lb (84.4 kg)   BP Readings from Last 3 Encounters:  07/28/19 116/78  06/16/19 138/90  06/01/19 122/84   Pulse Readings from Last 3 Encounters:  07/28/19 84  06/16/19 80  06/01/19 96    Current Outpatient Medications  Medication Sig Dispense Refill  . cyclobenzaprine (FLEXERIL) 5 MG tablet Take 1 tablet (5 mg total) by mouth 3 (three)  times daily. (Patient taking differently: Take 5 mg by mouth at bedtime. ) 90 tablet 5  . valsartan (DIOVAN) 160 MG tablet TAKE 1 TABLET BY MOUTH EVERY DAY 30 tablet 8  . carvedilol (COREG) 6.25 MG tablet Take 1 tablet (6.25 mg total) by mouth 2 (two) times daily. 180 tablet 3   No current facility-administered medications for this visit.    Allergies  Allergen Reactions  . Ciprofloxacin Other (See Comments)    Joint pain   . Levofloxacin Other (See Comments)    Joint pain   . Meloxicam Nausea Only  . Sulfa Antibiotics Nausea And Vomiting and Rash    Past Medical History:  Diagnosis Date  . Acute bronchitis 12/21/2009   Qualifier: History of  By: Annamaria Boots MD, Clinton D   . ALLERGIC  RHINITIS 12/25/2009   Qualifier: Diagnosis of  By: Annamaria Boots MD, Clinton D   . Cervical herniated disc 07/28/2017   Overview:  [facility] - Dr. Collier Salina (retired)  . 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 Depression with anxiety 10/08/2017  . Excessive daytime sleepiness 10/08/2017  . GERD (gastroesophageal reflux disease) 08/17/2012  . 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 Ultrasound/cla  . Leg length discrepancy 07/28/2017  . Paresthesia 07/28/2018  . Snoring 10/08/2017  . 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 Earlean Shawl - q 5 years colonoscopy. Last 2013/cla  . Varicocele 04/19/2013   Overview:  Dr. Karsten Ro    Blood pressure 116/78, pulse 84, resp. rate 15, height 5\' 9"  (1.753 m), weight 190 lb 12.8 oz (86.5 kg), SpO2 99 %.  Repeat BP in office 126/89 with patient meter, 122/84 with office meter    Hypertension Patient with diastolic hypertension better controlled, but still not to goal.  There is not much room to increase his valsartan from 160 to 320, as it will probably have too much impact on systolic readings.  Will increase his carvedilol from 3.125 to 6.25 mg twice daily to see if we can get some further diastolic lowering.  Patient will continue to monitor home BP readings 3-4 times per week and return in 6 weeks for follow up.     Tommy Medal PharmD CPP [facility] [address] [place], [place] [address] [phone]

## 2019-07-28 NOTE — Assessment & Plan Note (Signed)
Patient with diastolic hypertension better controlled, but still not to goal.  There is not much room to increase his valsartan from 160 to 320, as it will probably have too much impact on systolic readings.  Will increase his carvedilol from 3.125 to 6.25 mg twice daily to see if we can get some further diastolic lowering.  Patient will continue to monitor home BP readings 3-4 times per week and return in 6 weeks for follow up.

## 2019-07-28 NOTE — Patient Instructions (Signed)
Return for a a follow up appointment March 18 at 8 am  Check your blood pressure at home daily and keep record of the readings.  Take your BP meds as follows:  Increase carvedilol from 3.125 mg twice daily to 6.25 mg twice daily. Take 2 of the 3.125 mg tablets twice daily until gone.  New prescription is at [place] [facility] all of your meds, your BP cuff and your record of home blood pressures to your next appointment.  Exercise as you're able, try to walk approximately 30 minutes per day.  Keep salt intake to a minimum, especially watch canned and prepared boxed foods.  Eat more fresh fruits and vegetables and fewer canned items.  Avoid eating in fast food restaurants.    HOW TO TAKE YOUR BLOOD PRESSURE: . Rest 5 minutes before taking your blood pressure. .  Don't smoke or drink caffeinated beverages for at least 30 minutes before. . Take your blood pressure before (not after) you eat. . Sit comfortably with your back supported and both feet on the floor (don't cross your legs). . Elevate your arm to heart level on a table or a desk. . Use the proper sized cuff. It should fit smoothly and snugly around your bare upper arm. There should be enough room to slip a fingertip under the cuff. The bottom edge of the cuff should be 1 inch above the crease of the elbow. . Ideally, take 3 measurements at one sitting and record the average.

## 2019-08-06 IMAGING — MR MR THORACIC SPINE W/O CM
4 of 6 series · 21 of 48 positions shown · non-contrast
Comparison: CT abdomen pelvis 12/14/2007. MRI thoracic spine
01/31/2017

CLINICAL DATA: Thoracic back pain

EXAM:
MRI THORACIC SPINE WITHOUT CONTRAST
TECHNIQUE: Multiplanar, multisequence MR imaging of the thoracic spine was
performed. No intravenous contrast was administered.

[Series 2: sag (id) · sagittal · 3.0mm · 1.88mm/px · 3 of 14 slices shown]
[im 1/14]
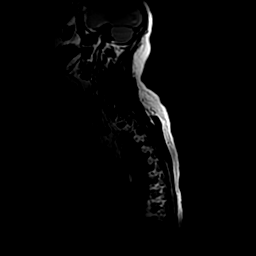
[im 7/14]
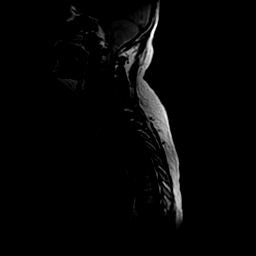
[im 14/14]
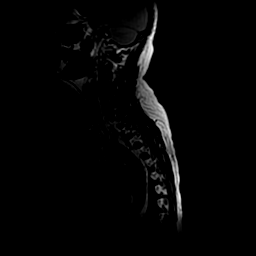

[Series 6: T1 · sagittal · 3.0mm · 0.66mm/px · 5 of 14 slices shown]
[im 1/14]
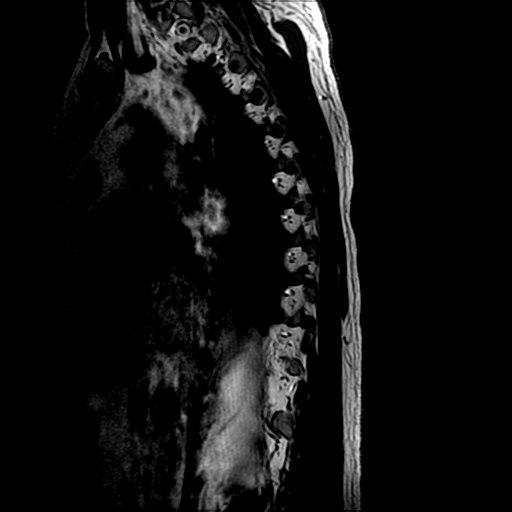
[im 4/14]
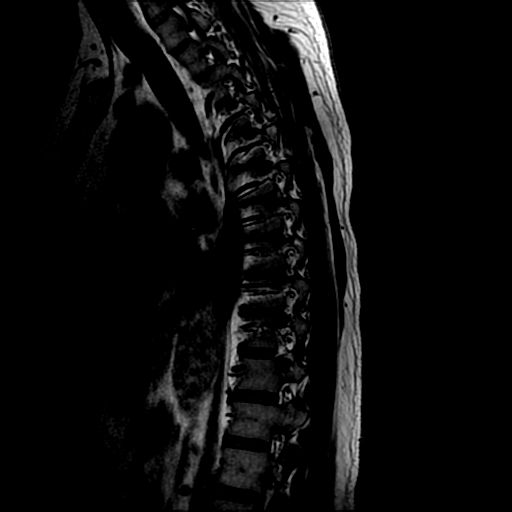
[im 7/14]
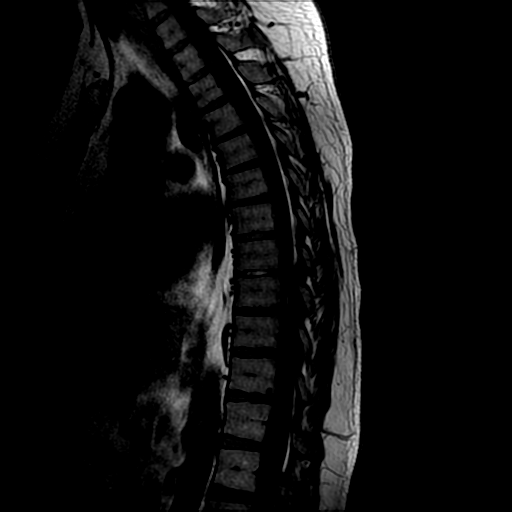
[im 10/14]
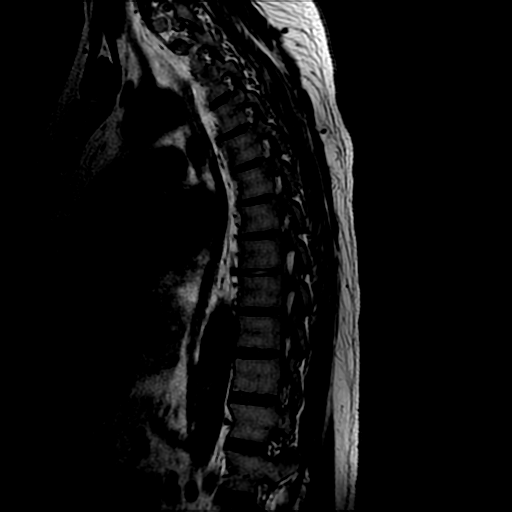
[im 14/14]
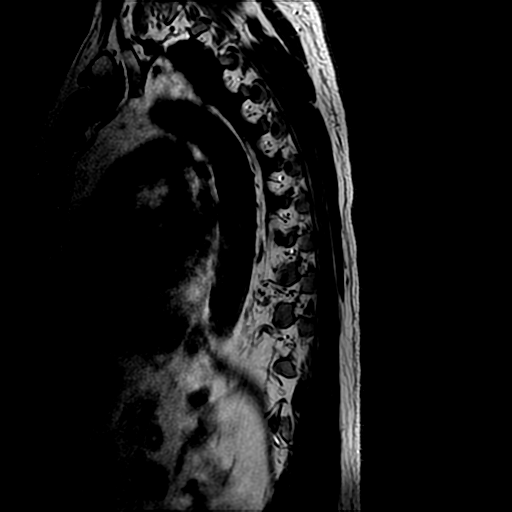

[Series 7: T2 post-contrast · sagittal · 3.0mm · 0.66mm/px · 5 of 14 slices shown]
[im 1/14]
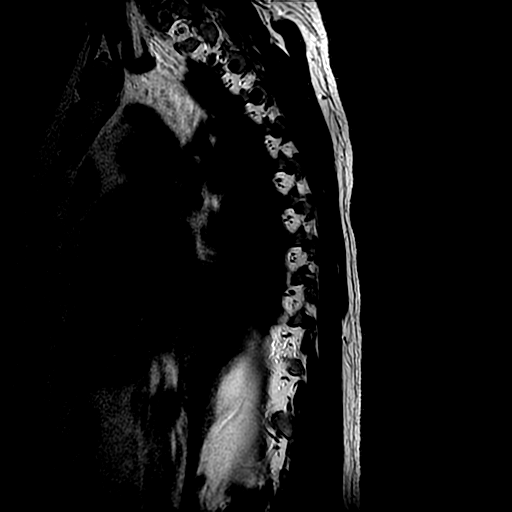
[im 4/14]
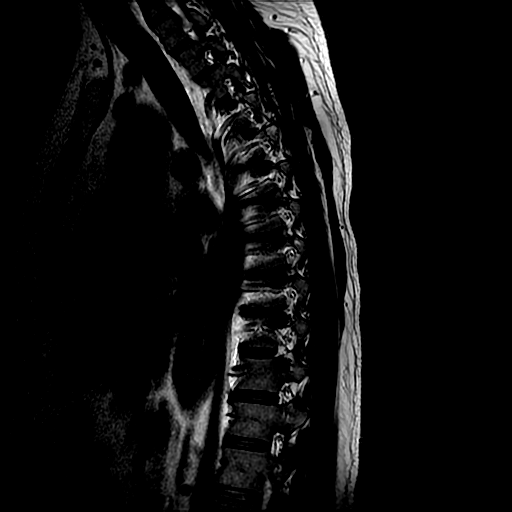
[im 7/14]
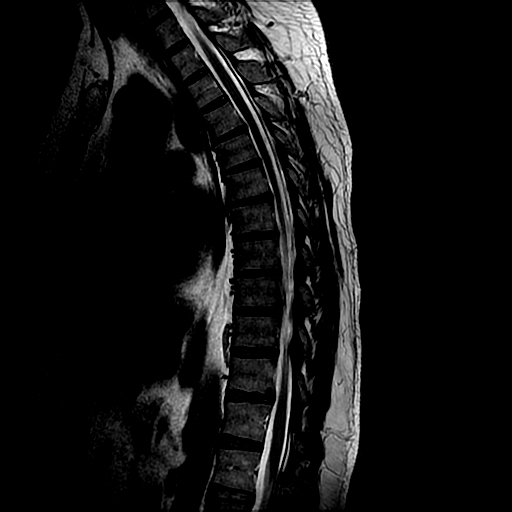
[im 10/14]
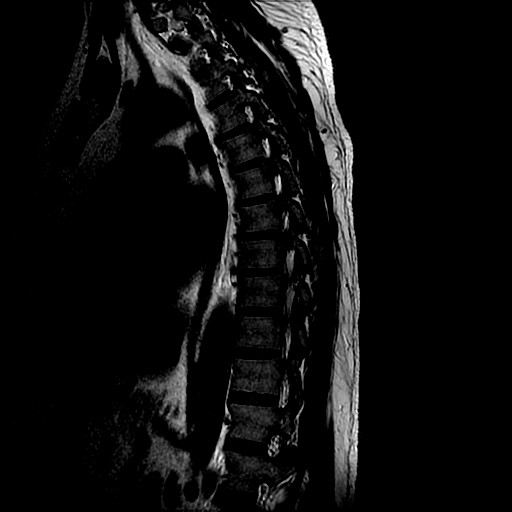
[im 14/14]
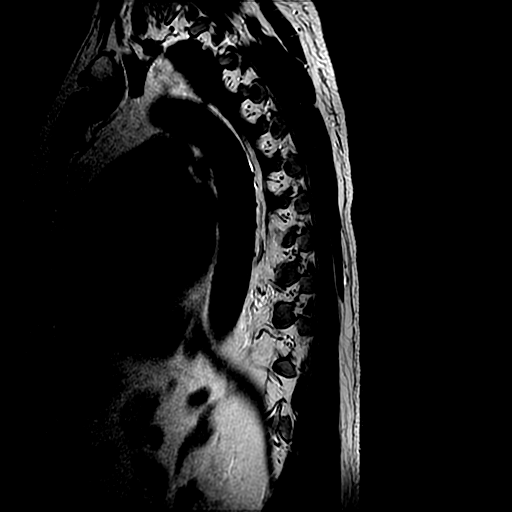

[Series 8: T2 · axial · 4.0mm · 0.41mm/px · z∈[-261,-43]mm · 8 of 36 slices shown]
[im 1/36]
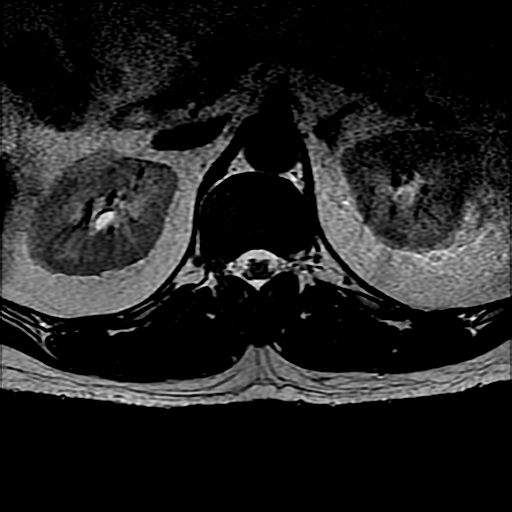
[im 6/36]
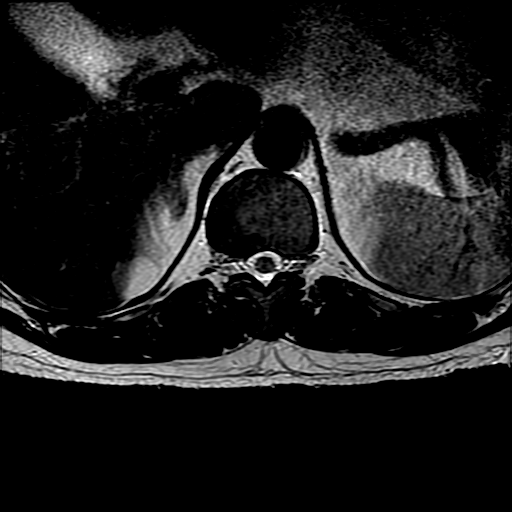
[im 11/36]
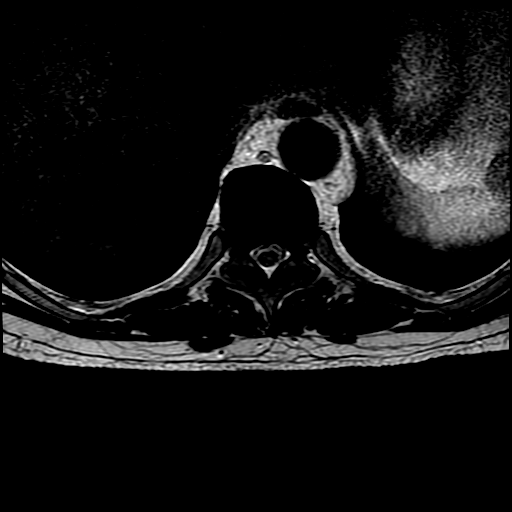
[im 17/36]
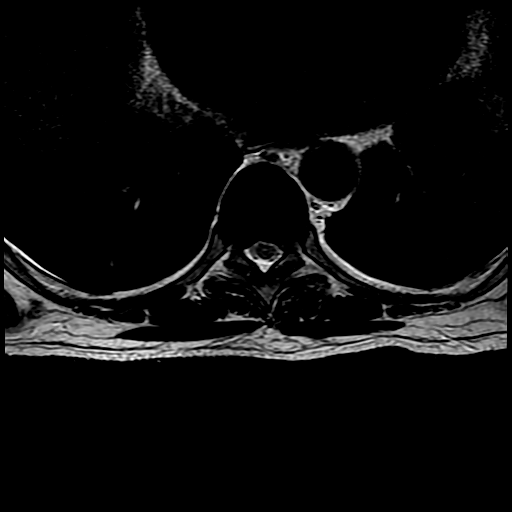
[im 19/36]
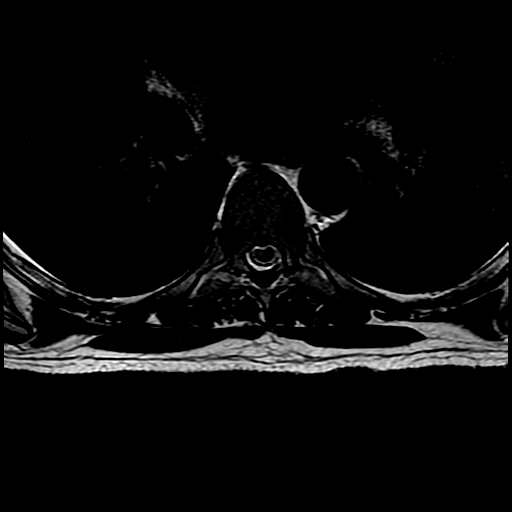
[im 25/36]
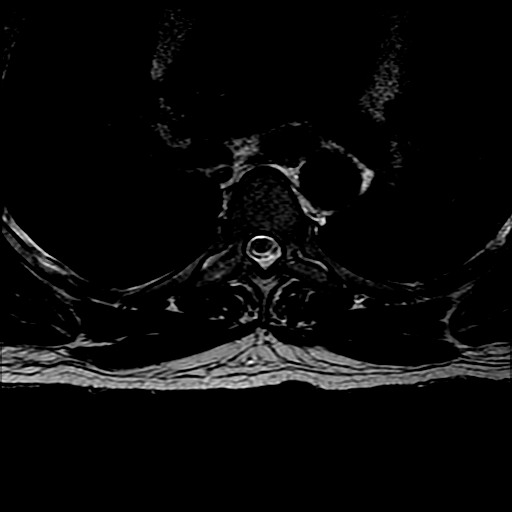
[im 30/36]
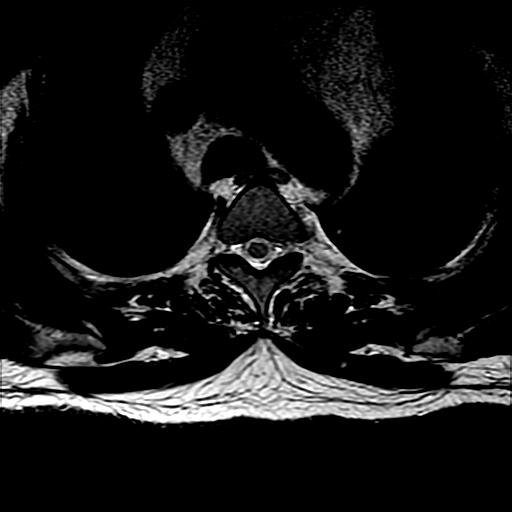
[im 36/36]
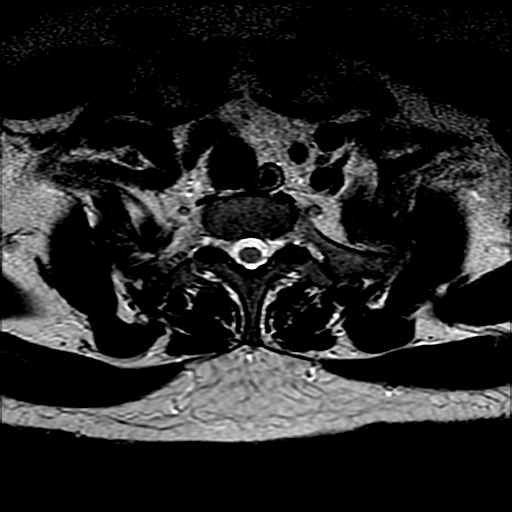

[21 of 48 positions shown; findings below may reference images not displayed]

FINDINGS: Alignment:  Mild retrolisthesis T7-8, T8-9, T9-10

Vertebrae: Negative for fracture or mass

Cord: Small syrinx in the cord at T8 measuring 1 mm. Small syrinx in
the cord at T10-11 measuring 1 mm. This is difficult to see on the
prior study due to motion. These are seen on the axial images
previously.

Paraspinal and other soft tissues: 18 x 16 mm T2 hyperintense lesion
in the posterior right lobe of the liver. No change from 9822. This
has increased in size since the prior CT of 6884.

No paraspinous mass or adenopathy.

Disc levels:

Multilevel disc degeneration in the thoracic spine. No significant
spinal stenosis or cord compression.

Small central disc protrusion T4-5 unchanged

Small central disc protrusion T5-6 unchanged

Small central disc protrusion T6-7 with mild cord flattening. No
interval change.

Small left paracentral disc protrusion T7-8 with cord flattening. No
interval change.

Small central disc protrusion T8-9 unchanged

Disc degeneration and spurring T9-10 with small right-sided disc
protrusion unchanged

Mild facet degeneration bilaterally T10-11 without significant
stenosis.
IMPRESSION: Multilevel degenerative change throughout the lumbar spine. Multiple
small disc protrusions without interval change from 9822.

Minimal thoracic cord syrinx, difficult to see on the prior study
but possibly present.

## 2019-09-08 ENCOUNTER — Ambulatory Visit: Payer: 59

## 2019-10-13 ENCOUNTER — Ambulatory Visit (INDEPENDENT_AMBULATORY_CARE_PROVIDER_SITE_OTHER): Payer: 59 | Admitting: Pharmacist Clinician (PhC)/ Clinical Pharmacy Specialist

## 2019-10-13 ENCOUNTER — Other Ambulatory Visit: Payer: Self-pay

## 2019-10-13 DIAGNOSIS — I10 Essential (primary) hypertension: Secondary | ICD-10-CM | POA: Diagnosis not present

## 2019-10-13 NOTE — Assessment & Plan Note (Signed)
Patient with mostly diastolic hypertension, doing better with combination of valsartan and carvedilol.  Although the diastolic is still slightly elevated, patient is very hesitant to increase or add medications.   Patient had multiple concerns that were addressed today.  He was concerned about the risk of cancer with long term valsartan use - explained that this was the reason for 2018 recall and that with changes made is no longer a concern.   He was also concerned that after increasing the valsartan to 160 mg earlier this year he had swelling in the lymph nodes in his neck.  He also mentioned that around the same time he was diagnosed with mononucleosis.  Explained that swollen lymph nodes is a common issue with mono, and that it can last for several weeks.  Not related to valsartan dose.  Lastly he asked about decreasing dose of valsartan now that his readings look better.  Had a full discussion about lifestyle modifications that could potentially decrease his need for medication, and that he would need to be successful with those for 4-6 months before we would consider cutting meds back.  Also explained in detail the concerns with ongoing uncontrolled diastolic hypertension in younger patients, including increased risk of heart failure and stroke as he aged.    We won't make any medication changes today, and he was encouraged to work on lifestyle modifications to help with control.  He is due to see Dr. Percival Spanish in September for follow up.

## 2019-10-13 NOTE — Progress Notes (Signed)
10/13/2019 Evan Henry 16-Jul-1970 [id number]   HPI:  Evan Henry is a 49 y.o. male patient of Dr Percival Spanish, who presents today for hypertension clinic follow up.  He originally saw Dr. Percival Spanish in September for cardiac evaluation.   In addition to hypertension he has OSA which is treated with CPAP.    In July he had an episode of hypotension, for which he went to the ED.  He notes that since then his BP has been steadily climbing.  At the September visit his pressure was 136/92.  He was prescribed chlorthalidone 12.5 mg, however he never took, as he was concerned about possible dehydration.  He was instead given amlodipine 2.5 mg qd.  A recent Coronary Calcium test showed a score of 0.   His carvedilol and amlodipine doses have been increased and decreased over the past several months, in part due to patient compliance.  At his last visit with me (about 6 weeks ago) I stopped the amlodipine and started him on valsartan 160 mg daily.  Metabolic panel 2 weeks later were WNL.   At his last visit, BP was well controlled at 116/79.  He had noted a few days of blurry vision and headaches after then increase in valsartan dose, but this subsided without incident.    Today he returns for follow up.  He notes that most of his home BP readings have been in the 120/80's range and he would like to consider decreasing valsartan dose, as his pressure is now controlled.   He has concerns for potential cancer from medication as well as swollen lymph nodes after dose increase.     Blood Pressure Goal:  130/80  Current Medications: valsartan 160 mg, carvedilol 3.125  Family Hx: father hld, MI (late 70's), CAD, htn, - now 68  Mother - no heart issues - now 66  Brother had MI, stroke at 55, still living  One 1/2 brother with pacemaker (dad)  One 1/2 sister with some cardiac issues (dad)  Son 93 - healthy  Social Hx: no tobacco, grew up with secondhand smoke; only occasional alcohol; coffee (high end, 1 -2  cups per day)  Diet: mostly eating out, daily, fast food ([employer], [employer], [employer]); not much fruits/vegetables; snacks cookies - not too often  - - better with eating out less; more home cooked - dinner daily at home now - 2 veggies nightly  Exercise: 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  Looking at past 10 days of morning and evening readings, AM average 119/87 and PM average 112/80  Intolerances:  Quinolones, meloxicam, sulfa  Labs: 12/2018:  Na 138, K 3.8, Glu 112, BUN 12, SCr 0.99  1/21:  Na 136, K 4.4, Glu 87, BUN 12, SCR 0.78 (on valsartan 160 mg)  Wt Readings from Last 3 Encounters:  10/13/19 190 lb (86.2 kg)  07/28/19 190 lb 12.8 oz (86.5 kg)  06/16/19 187 lb 9.6 oz (85.1 kg)   BP Readings from Last 3 Encounters:  10/13/19 130/88  07/28/19 116/78  06/16/19 138/90   Pulse Readings from Last 3 Encounters:  10/13/19 75  07/28/19 84  06/16/19 80    Current Outpatient Medications  Medication Sig Dispense Refill  . carvedilol (COREG) 6.25 MG tablet Take 1 tablet (6.25 mg total) by mouth 2 (two) times daily. [place]  tablet 3  . valsartan (DIOVAN) 160 MG tablet TAKE 1 TABLET BY MOUTH EVERY DAY 30 tablet 8   No current facility-administered medications for this visit.    Allergies  Allergen Reactions  . Ciprofloxacin Other (See Comments)    Joint pain   . Levofloxacin Other (See Comments)    Joint pain   . Meloxicam Nausea Only  . Sulfa Antibiotics Nausea And Vomiting and Rash    Past Medical History:  Diagnosis Date  . Acute bronchitis 12/21/2009   Qualifier: History of  By: Annamaria Boots MD, Clinton D   . ALLERGIC RHINITIS 12/25/2009   Qualifier: Diagnosis of  By: Annamaria Boots MD, Clinton D   . Cervical herniated disc 07/28/2017   Overview:  [facility] - Dr. Collier Salina (retired)  . 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 Depression with anxiety 10/08/2017  . Excessive daytime sleepiness 10/08/2017  . GERD (gastroesophageal reflux disease) 08/17/2012  . 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 Ultrasound/cla  . Leg length discrepancy 07/28/2017  . Paresthesia 07/28/2018  . Snoring 10/08/2017  . 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 Earlean Shawl - q 5 years colonoscopy. Last 2013/cla  . Varicocele 04/19/2013   Overview:  Dr. Karsten Ro    Blood pressure 130/88, pulse 75, temperature 98.3 F (36.8 C), resp. rate 16, height 5\' 9"  (1.753 m), weight 190 lb (86.2 kg), SpO2 99 %.    Hypertension Patient with mostly diastolic hypertension, doing better with combination of valsartan and carvedilol.  Although the diastolic is still slightly elevated, patient is very hesitant to increase or add medications.   Patient had multiple concerns that were addressed today.  He was concerned about the risk of cancer with long term valsartan use - explained that this was the reason for 2018 recall and that with changes made is no longer a concern.   He was also concerned that after increasing the valsartan to 160 mg earlier this year he had swelling in the lymph nodes in his neck.  He also mentioned that around the same time he was diagnosed with mononucleosis.  Explained that swollen lymph nodes is a common issue with mono, and that it can last for several weeks.  Not related to valsartan dose.  Lastly he asked about decreasing dose of valsartan now that his readings look better.  Had a full discussion about lifestyle modifications that could potentially decrease his need for medication, and that he would need to be successful with those for 4-6 months before we would consider cutting meds back.  Also explained in detail the concerns with ongoing uncontrolled diastolic hypertension in younger  patients, including increased risk of heart failure and stroke as he aged.    We won't make any medication changes today, and he was encouraged to work on lifestyle modifications to help with control.  He is due to see Dr. Percival Spanish in September for follow up.   Tommy Medal PharmD CPP [facility] [address] [place], [place] [address] [phone]

## 2019-10-13 NOTE — Patient Instructions (Addendum)
  Check your blood pressure at home several times each week and keep record of the readings. ? ?Take your BP meds as follows: ? Continue with all current medications ? ?Bring all of your meds, your BP cuff and your record of home blood pressures to your next appointment.  Exercise as you?re able, try to walk approximately 30 minutes per day.  Keep salt intake to a minimum, especially watch canned and prepared boxed foods.  Eat more fresh fruits and vegetables and fewer canned items.  Avoid eating in fast food restaurants.  ? ? HOW TO TAKE YOUR BLOOD PRESSURE: ?Rest 5 minutes before taking your blood pressure. ? Don?t smoke or drink caffeinated beverages for at least 30 minutes before. ?Take your blood pressure before (not after) you eat. ?Sit comfortably with your back supported and both feet on the floor (don?t cross your legs). ?Elevate your arm to heart level on a table or a desk. ?Use the proper sized cuff. It should fit smoothly and snugly around your bare upper arm. There should be enough room to slip a fingertip under the cuff. The bottom edge of the cuff should be 1 inch above the crease of the elbow. ?Ideally, take 3 measurements at one sitting and record the average. ? ? ?

## 2020-02-04 IMAGING — US RIGHT LOWER EXTREMITY VENOUS ULTRASOUND
1 series · 14 of 24 positions shown · non-contrast
Comparison: None

CLINICAL DATA: Pain x2 months

EXAM:
RIGHT LOWER EXTREMITY VENOUS DOPPLER ULTRASOUND
TECHNIQUE: Gray-scale sonography with compression, as well as color and duplex
ultrasound, were performed to evaluate the deep venous system from
the level of the common femoral vein through the popliteal and
proximal calf veins.

[Series 1: right lower extremity venous ultrasound · 14 of 33 slices shown]
[im 1/33]
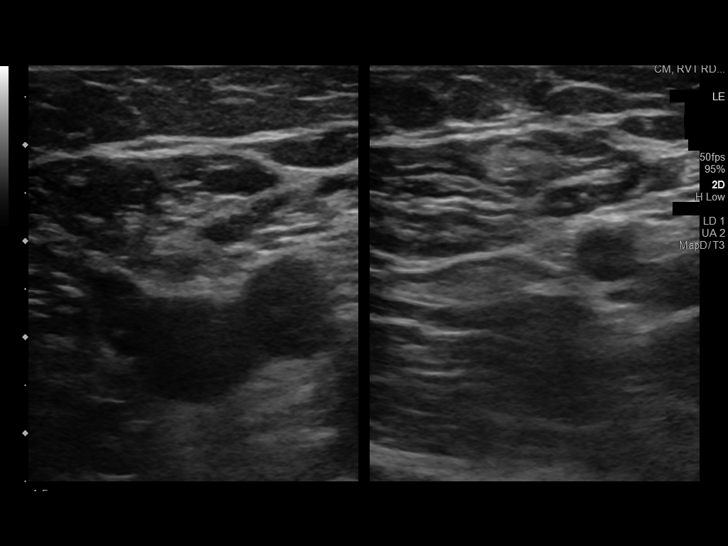
[im 3/33]
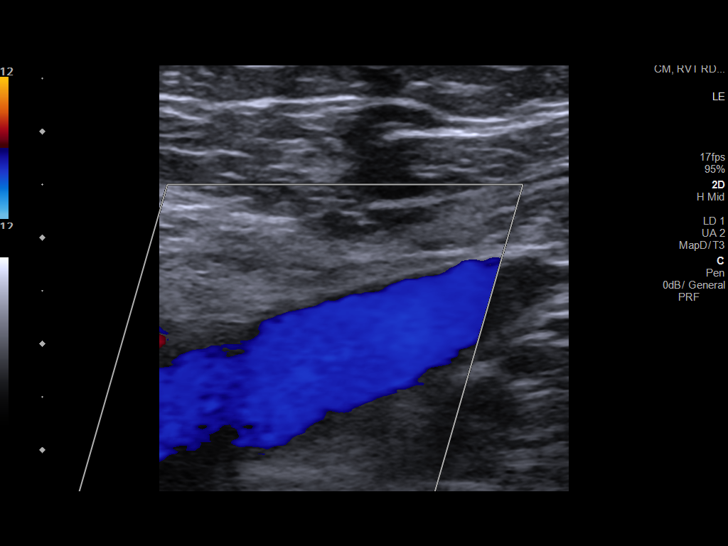
[im 6/33]
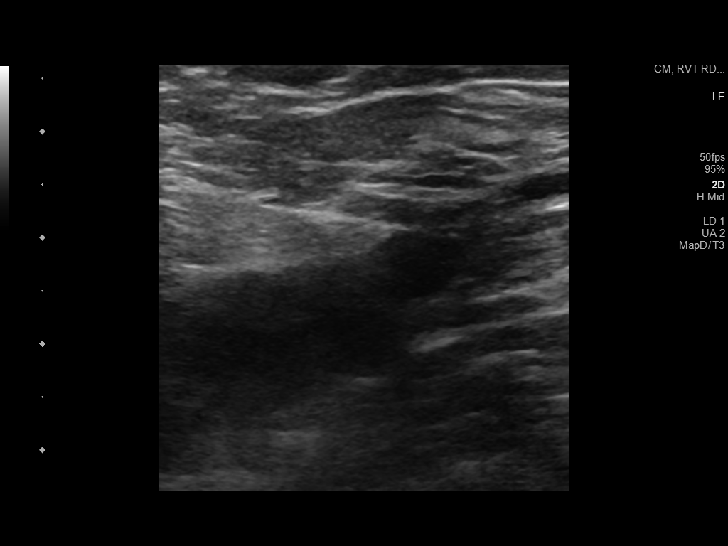
[im 9/33]
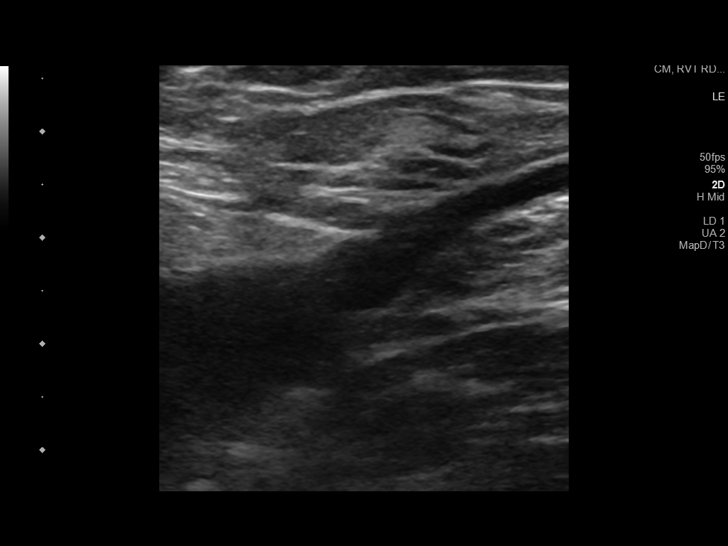
[im 10/33]
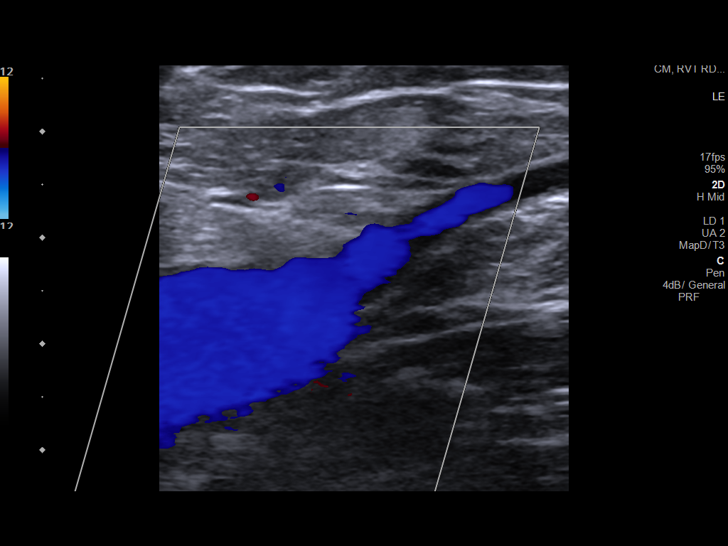
[im 13/33]
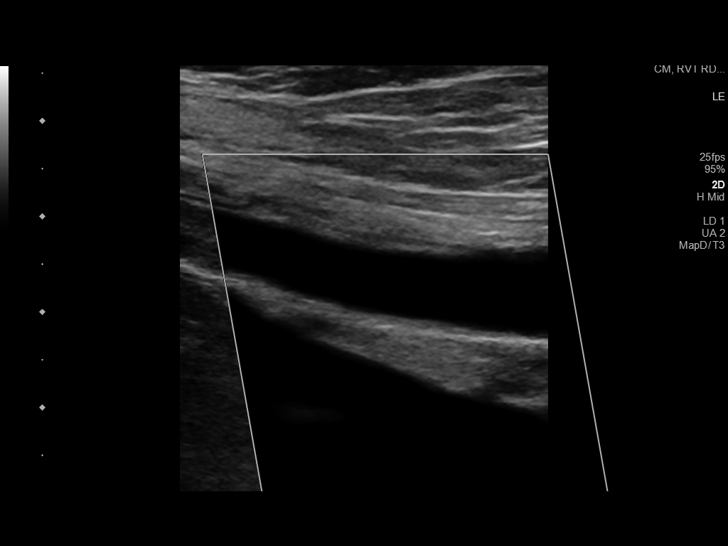
[im 16/33]
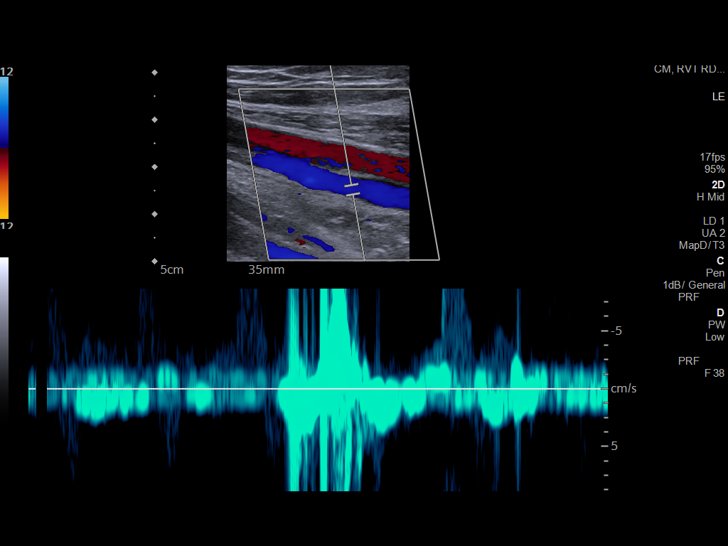
[im 17/33]
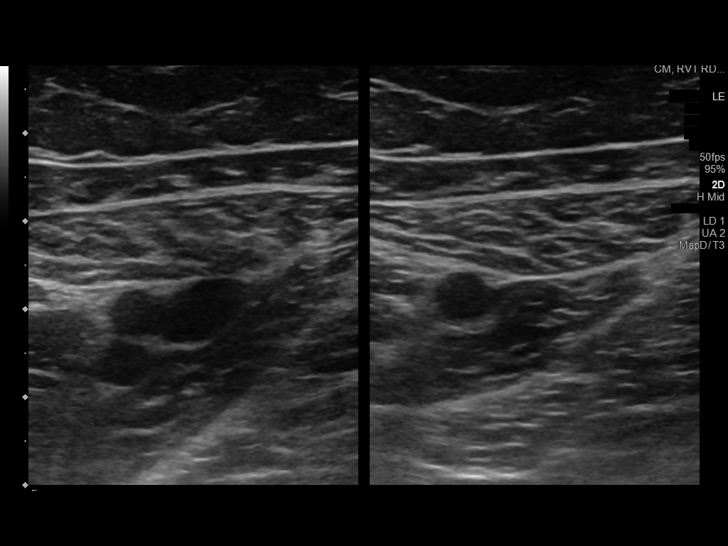
[im 20/33]
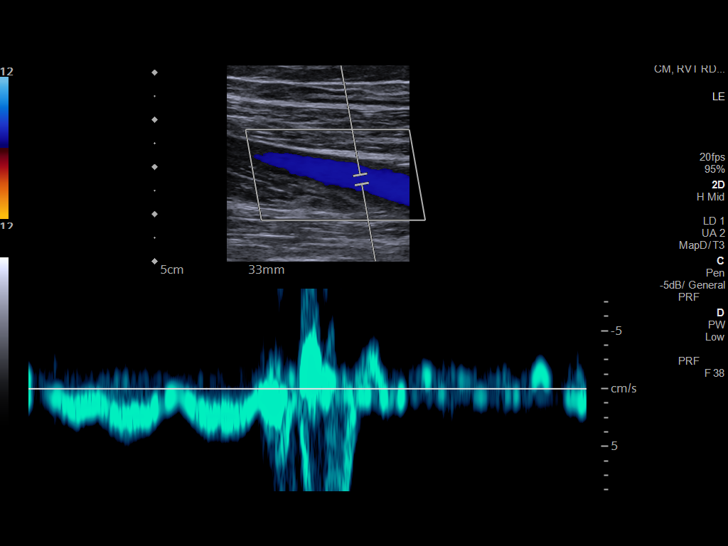
[im 23/33]
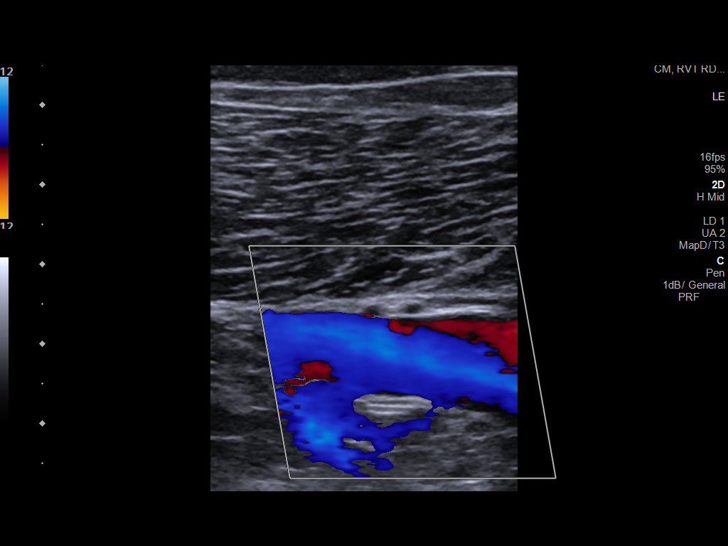
[im 26/33]
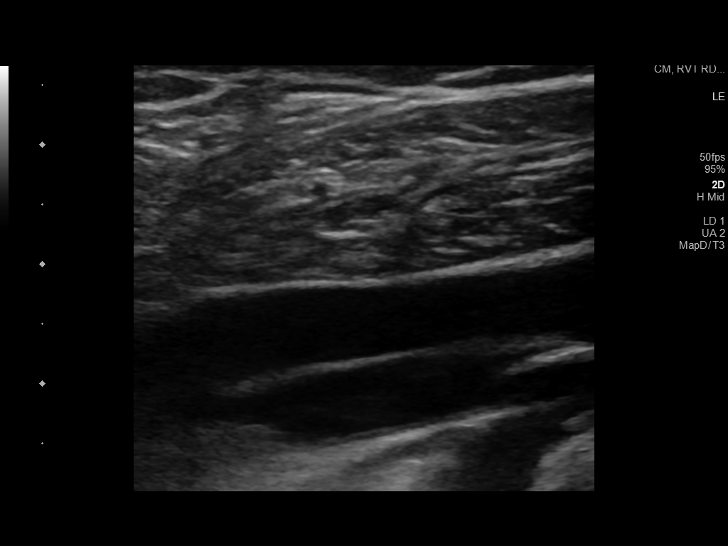
[im 27/33]
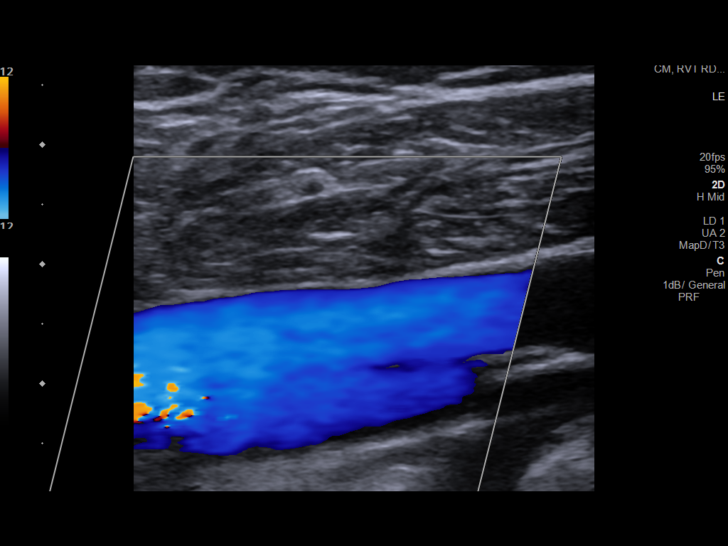
[im 30/33]
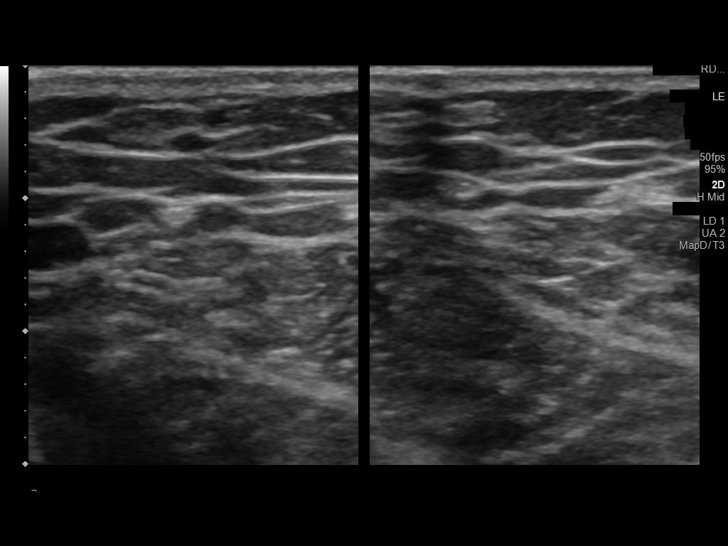
[im 33/33]
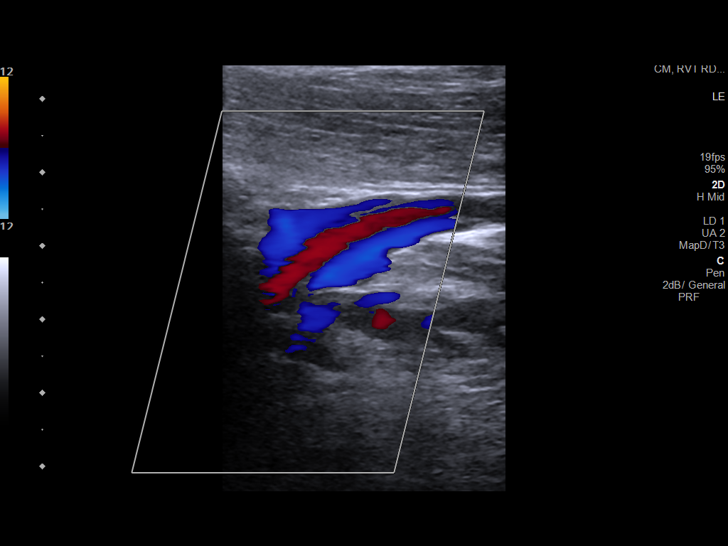

[14 of 24 positions shown; findings below may reference images not displayed]

FINDINGS: Normal compressibility of the common femoral, superficial femoral,
and popliteal veins, as well as the proximal calf veins. No filling
defects to suggest DVT on grayscale or color Doppler imaging.
Doppler waveforms show normal direction of venous flow, normal
respiratory phasicity and response to augmentation. Survey views of
the contralateral common femoral vein are unremarkable.
IMPRESSION: No femoropopliteal and no calf DVT in the visualized calf veins. If
clinical symptoms are inconsistent or if there are persistent or
worsening symptoms, further imaging (possibly involving the iliac
veins) may be warranted.

## 2020-02-23 ENCOUNTER — Encounter: Payer: Self-pay | Admitting: Cardiology

## 2020-03-09 ENCOUNTER — Ambulatory Visit: Payer: 59 | Admitting: Cardiology

## 2020-04-04 ENCOUNTER — Telehealth: Payer: Self-pay | Admitting: Cardiology

## 2020-04-04 NOTE — Telephone Encounter (Signed)
Reviewed pt chart concerning refill for valsartan. Pt saw Cardiologist in [facility] yesterday. Note states that pt can stop valsartan and ordered start of Lisinopril. However, pt has called asking for refill of Valsartan.   Routing to [employer], RPH-CPP to advise pt.

## 2020-04-04 NOTE — Telephone Encounter (Signed)
Spoke with patient.  He states that although he was given lisinopril only yesterday, and has not picked up the prescription, he doesn't want to take it.  States friend had dry cough from this, and he doesn't want to bother with something that will cause this side effect.  States he only has 3-4 valsartan tablets and would like refill until new MD can prescribe something else.    Advised that he call new MD, as we haven't seen him in 6 months.  Let them know what his concern is and they can prescribe something else or continue with the valsartan.  Patient voiced understanding.

## 2020-04-04 NOTE — Telephone Encounter (Signed)
*  STAT* If patient is at the pharmacy, call can be transferred to refill team.   1. Which medications need to be refilled? (please list name of each medication and dose if known) valsartan (DIOVAN) 160 MG tablet  2. Which pharmacy/location (including street and city if local pharmacy) is medication to be sent to? [id number] - [place], [place] - [address]  3. Do they need a 30 day or 90 day supply? 90 day Patient only has 4 days left.

## 2020-04-13 ENCOUNTER — Telehealth: Payer: Self-pay | Admitting: Neurology

## 2020-04-13 NOTE — Telephone Encounter (Signed)
Pt called stating that he is needing a prescription send to the [facility] for his cpap supplies. Please advise.

## 2020-04-16 NOTE — Telephone Encounter (Signed)
Called pt back. Advised him to call [facility] back and ask them to send order to [place] to be signed by Dr. Felecia Shelling at [phone]. Pt also did not have a follow up. Scheduled follow up for 06/25/20 at 1:30pm.

## 2020-06-25 ENCOUNTER — Ambulatory Visit: Payer: Self-pay | Admitting: Neurology

## 2021-05-31 ENCOUNTER — Encounter (HOSPITAL_COMMUNITY): Payer: Self-pay

## 2021-05-31 ENCOUNTER — Other Ambulatory Visit: Payer: Self-pay

## 2021-05-31 ENCOUNTER — Emergency Department (HOSPITAL_COMMUNITY): Payer: 59

## 2021-05-31 ENCOUNTER — Inpatient Hospital Stay (HOSPITAL_COMMUNITY)
Admission: EM | Admit: 2021-05-31 | Discharge: 2021-06-02 | DRG: 310 | Disposition: A | Discharge status: Home / Self Care | Payer: 59 | Attending: Internal Medicine | Admitting: Internal Medicine

## 2021-05-31 DIAGNOSIS — E876 Hypokalemia: Secondary | ICD-10-CM | POA: Diagnosis present

## 2021-05-31 DIAGNOSIS — Z79899 Other long term (current) drug therapy: Secondary | ICD-10-CM | POA: Diagnosis not present

## 2021-05-31 DIAGNOSIS — Z881 Allergy status to other antibiotic agents status: Secondary | ICD-10-CM

## 2021-05-31 DIAGNOSIS — I358 Other nonrheumatic aortic valve disorders: Secondary | ICD-10-CM | POA: Diagnosis present

## 2021-05-31 DIAGNOSIS — Z28310 Unvaccinated for covid-19: Secondary | ICD-10-CM

## 2021-05-31 DIAGNOSIS — Z20822 Contact with and (suspected) exposure to covid-19: Secondary | ICD-10-CM | POA: Diagnosis present

## 2021-05-31 DIAGNOSIS — Z823 Family history of stroke: Secondary | ICD-10-CM

## 2021-05-31 DIAGNOSIS — Z86010 Personal history of colonic polyps: Secondary | ICD-10-CM | POA: Diagnosis not present

## 2021-05-31 DIAGNOSIS — Z8249 Family history of ischemic heart disease and other diseases of the circulatory system: Secondary | ICD-10-CM

## 2021-05-31 DIAGNOSIS — M419 Scoliosis, unspecified: Secondary | ICD-10-CM | POA: Diagnosis present

## 2021-05-31 DIAGNOSIS — I4891 Unspecified atrial fibrillation: Secondary | ICD-10-CM | POA: Diagnosis present

## 2021-05-31 DIAGNOSIS — I10 Essential (primary) hypertension: Secondary | ICD-10-CM | POA: Diagnosis present

## 2021-05-31 DIAGNOSIS — G4733 Obstructive sleep apnea (adult) (pediatric): Secondary | ICD-10-CM | POA: Diagnosis present

## 2021-05-31 DIAGNOSIS — Z825 Family history of asthma and other chronic lower respiratory diseases: Secondary | ICD-10-CM | POA: Diagnosis not present

## 2021-05-31 DIAGNOSIS — Z9989 Dependence on other enabling machines and devices: Secondary | ICD-10-CM | POA: Diagnosis not present

## 2021-05-31 LAB — CBC WITH DIFFERENTIAL/PLATELET
Abs Immature Granulocytes: 0.08 10*3/uL — ABNORMAL HIGH (ref 0.00–0.07)
Basophils Absolute: 0 10*3/uL (ref 0.0–0.1)
Basophils Relative: 0 %
Eosinophils Absolute: 0 10*3/uL (ref 0.0–0.5)
Eosinophils Relative: 0 %
HCT: 41.9 % (ref 39.0–52.0)
Hemoglobin: 14.3 g/dL (ref 13.0–17.0)
Immature Granulocytes: 1 %
Lymphocytes Relative: 6 %
Lymphs Abs: 0.8 10*3/uL (ref 0.7–4.0)
MCH: 29.1 pg (ref 26.0–34.0)
MCHC: 34.1 g/dL (ref 30.0–36.0)
MCV: 85.3 fL (ref 80.0–100.0)
Monocytes Absolute: 0.5 10*3/uL (ref 0.1–1.0)
Monocytes Relative: 4 %
Neutro Abs: 12.2 10*3/uL — ABNORMAL HIGH (ref 1.7–7.7)
Neutrophils Relative %: 89 %
Platelets: 262 10*3/uL (ref 150–400)
RBC: 4.91 MIL/uL (ref 4.22–5.81)
RDW: 13.2 % (ref 11.5–15.5)
WBC: 13.6 10*3/uL — ABNORMAL HIGH (ref 4.0–10.5)
nRBC: 0 % (ref 0.0–0.2)

## 2021-05-31 LAB — COMPREHENSIVE METABOLIC PANEL
ALT: 19 U/L (ref 0–44)
AST: 16 U/L (ref 15–41)
Albumin: 4.4 g/dL (ref 3.5–5.0)
Alkaline Phosphatase: 53 U/L (ref 38–126)
Anion gap: 10 (ref 5–15)
BUN: 16 mg/dL (ref 6–20)
CO2: 24 mmol/L (ref 22–32)
Calcium: 8.8 mg/dL — ABNORMAL LOW (ref 8.9–10.3)
Chloride: 101 mmol/L (ref 98–111)
Creatinine, Ser: 0.71 mg/dL (ref 0.61–1.24)
GFR, Estimated: >60 mL/min (ref >60)
Glucose, Bld: 131 mg/dL — ABNORMAL HIGH (ref 70–99)
Potassium: 3.9 mmol/L (ref 3.5–5.1)
Sodium: 135 mmol/L (ref 135–145)
Total Bilirubin: 1.1 mg/dL (ref 0.3–1.2)
Total Protein: 7.2 g/dL (ref 6.5–8.1)

## 2021-05-31 LAB — RESP PANEL BY RT-PCR (FLU A&B, COVID) ARPGX2
Influenza A by PCR: NEGATIVE
Influenza B by PCR: NEGATIVE
SARS Coronavirus 2 by RT PCR: NEGATIVE

## 2021-05-31 LAB — TROPONIN I (HIGH SENSITIVITY)
Troponin I (High Sensitivity): 3 ng/L (ref <18)
Troponin I (High Sensitivity): 4 ng/L (ref <18)

## 2021-05-31 MED ORDER — DILTIAZEM HCL-DEXTROSE 125-5 MG/125ML-% IV SOLN (PREMIX)
5.0000 mg/h | INTRAVENOUS | Status: DC
  Administered 2021-05-31 – 2021-06-01 (×2): 5 mg/h via INTRAVENOUS
  Filled 2021-05-31 (×2): qty 125

## 2021-05-31 MED ORDER — METOPROLOL TARTRATE 5 MG/5ML IV SOLN
5.0000 mg | INTRAVENOUS | Status: AC
  Administered 2021-05-31: 5 mg via INTRAVENOUS
  Filled 2021-05-31: qty 5

## 2021-05-31 MED ORDER — SODIUM CHLORIDE 0.9 % IV BOLUS
1000.0000 mL | Freq: Once | INTRAVENOUS | Status: AC
  Administered 2021-05-31: 1000 mL via INTRAVENOUS

## 2021-05-31 MED ORDER — DILTIAZEM HCL 25 MG/5ML IV SOLN
10.0000 mg | Freq: Once | INTRAVENOUS | Status: AC
  Administered 2021-05-31: 10 mg via INTRAVENOUS

## 2021-05-31 NOTE — ED Provider Notes (Signed)
[facility] EMERGENCY DEPARTMENT Provider Note   CSN: [id number] Arrival date & time: 05/31/21  2016     History Chief Complaint  Patient presents with   AFIB RVR    Evan Henry is a 50 y.o. male.  Patient states he has been doing a spiritual fasting and not eating or drinking since 5 PM last night.  He started today with feeling his heart beating fast   Palpitations Palpitations quality:  Irregular Onset quality:  Sudden Timing:  Constant Progression:  Worsening Chronicity:  New Context: not anxiety   Relieved by:  Nothing Worsened by:  Nothing Ineffective treatments:  None tried Associated symptoms: no back pain, no chest pain and no cough       Past Medical History:  Diagnosis Date   Acute bronchitis 12/21/2009   Qualifier: History of  By: Annamaria Boots MD, Clinton D    ALLERGIC RHINITIS 12/25/2009   Qualifier: Diagnosis of  By: Annamaria Boots MD, Clinton D    Cervical herniated disc 07/28/2017   Overview:  [facility] - Dr. 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 Earlean Shawl - q 5 years colonoscopy. Last 2013/cla   Varicocele 04/19/2013   Overview:  Dr. 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   Hepatic hemangioma 04/18/2010   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/2010   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 Authorizing Provider  carvedilol (COREG) 6.25 MG tablet Take 1 tablet (6.25 mg total) by mouth 2 (two) times daily. 07/28/19   [name], MD  valsartan (DIOVAN) 160 MG tablet TAKE 1 TABLET BY MOUTH EVERY DAY 07/11/19   [name], MD    Allergies    Ciprofloxacin,  Levofloxacin, Meloxicam, and Sulfa antibiotics  Review of Systems   Review of Systems  Constitutional:  Negative for appetite change and fatigue.  HENT:  Negative for congestion, ear discharge and sinus pressure.   Eyes:  Negative for discharge.  Respiratory:  Negative for cough.   Cardiovascular:  Positive for palpitations. Negative for chest pain.  Gastrointestinal:  Negative for abdominal pain and diarrhea.  Genitourinary:  Negative for frequency and hematuria.  Musculoskeletal:  Negative for back pain.  Skin:  Negative for rash.  Neurological:  Negative for seizures and headaches.   Psychiatric/Behavioral:  Negative for hallucinations.    Physical Exam Updated Vital Signs BP (!) 141/117   Pulse (!) 113   Temp (!) 97.5 F (36.4 C)   Resp 17   Ht 5\' 9"  (1.753 m)   Wt 87.5 kg   SpO2 98%   BMI 28.50 kg/m   Physical Exam Vitals and nursing note reviewed.  Constitutional:      Appearance: He is well-developed.  HENT:     Head: Normocephalic.     Nose: Nose normal.  Eyes:     General: No scleral icterus.    Conjunctiva/sclera: Conjunctivae normal.  Neck:     Thyroid: No thyromegaly.  Cardiovascular:     Rate and Rhythm: Tachycardia present. Rhythm irregular.     Heart sounds: No murmur heard.   No friction rub. No gallop.  Pulmonary:     Breath sounds: No stridor. No wheezing or rales.  Chest:     Chest wall: No tenderness.  Abdominal:     General: There is no distension.     Tenderness: There is no abdominal tenderness. There is no rebound.  Musculoskeletal:        General: Normal range of motion.     Cervical back: Neck supple.  Lymphadenopathy:     Cervical: No cervical adenopathy.  Skin:    Findings: No erythema or rash.  Neurological:     Mental Status: He is alert and oriented to person, place, and time.     Motor: No abnormal muscle tone.     Coordination: Coordination normal.  Psychiatric:        Behavior: Behavior normal.    ED Results / Procedures / Treatments   Labs (all labs ordered are listed, but only abnormal results are displayed) Labs Reviewed  CBC WITH DIFFERENTIAL/PLATELET - Abnormal; Notable for the following components:      Result Value   WBC 13.6 (*)    Neutro Abs 12.2 (*)    Abs Immature Granulocytes 0.08 (*)    All other components within normal limits  COMPREHENSIVE METABOLIC PANEL - Abnormal; Notable for the following components:   Glucose, Bld 131 (*)    Calcium 8.8 (*)    All other components within normal limits  RESP PANEL BY RT-PCR (FLU A&B, COVID) ARPGX2  TROPONIN I (HIGH SENSITIVITY)  TROPONIN I  (HIGH SENSITIVITY)    EKG None  Radiology DG Chest Port 1 View  Result Date: 05/31/2021 CLINICAL DATA:  Weakness. EXAM: PORTABLE CHEST 1 VIEW COMPARISON:  Chest x-ray 08/25/2017. FINDINGS: The heart size and mediastinal contours are within normal limits. Both lungs are clear. The visualized skeletal structures are unremarkable. IMPRESSION: No active disease. Electronically Signed   By: Ronney Asters M.D.   On: 05/31/2021 21:04    Procedures Procedures   Medications Ordered in ED Medications  diltiazem (CARDIZEM) 125 mg in dextrose 5% 125 mL (1 mg/mL) infusion (15 mg/hr Intravenous Rate/Dose  Change 05/31/21 2150)  sodium chloride 0.9 % bolus 1,000 mL (has no administration in time range)  diltiazem (CARDIZEM) injection 10 mg (10 mg Intravenous Bolus 05/31/21 2047)  sodium chloride 0.9 % bolus 1,000 mL (1,000 mLs Intravenous New Bag/Given 05/31/21 2046)  diltiazem (CARDIZEM) injection 10 mg (10 mg Intravenous Given 05/31/21 2128)  metoprolol tartrate (LOPRESSOR) injection 5 mg (5 mg Intravenous Given 05/31/21 2159)    ED Course  I have reviewed the triage vital signs and the nursing notes.  Pertinent labs & imaging results that were available during my care of the patient were reviewed by me and considered in my medical decision making (see chart for details). CRITICAL CARE Performed by: Milton Ferguson Total critical care time: 45 minutes Critical care time was exclusive of separately billable procedures and treating other patients. Critical care was necessary to treat or prevent imminent or life-threatening deterioration. Critical care was time spent personally by me on the following activities: development of treatment plan with patient and/or surrogate as well as nursing, discussions with consultants, evaluation of patient's response to treatment, examination of patient, obtaining history from patient or surrogate, ordering and performing treatments and interventions, ordering and review  of laboratory studies, ordering and review of radiographic studies, pulse oximetry and re-evaluation of patient's condition.    MDM Rules/Calculators/A&P                           Patient with rapid atrial fib new onset.  He is started on a diltiazem drip and also given some Lopressor IV.  He will be admitted to medicine Final Clinical Impression(s) / ED Diagnoses Final diagnoses:  Atrial fibrillation with RVR [facility])    Rx / DC Orders ED Discharge Orders     None        Milton Ferguson, MD 05/31/21 2236

## 2021-05-31 NOTE — ED Notes (Signed)
Gave pt urinal. Educated that he is too unstable to be standing

## 2021-05-31 NOTE — ED Notes (Signed)
Called pt placement for ICU bed

## 2021-05-31 NOTE — ED Notes (Signed)
Pt trying to convert into NSR but still goes into a fib.

## 2021-05-31 NOTE — ED Notes (Signed)
Allowing pt to stand and use urinal .

## 2021-05-31 NOTE — ED Triage Notes (Signed)
Hasn't been feeling good this evening. Throw up x1 and noticed that his heart was racing A FIB RVR with ems 120s-130s- no hx of this.

## 2021-06-01 ENCOUNTER — Inpatient Hospital Stay (HOSPITAL_COMMUNITY): Payer: 59

## 2021-06-01 DIAGNOSIS — Z9989 Dependence on other enabling machines and devices: Secondary | ICD-10-CM

## 2021-06-01 DIAGNOSIS — I10 Essential (primary) hypertension: Secondary | ICD-10-CM

## 2021-06-01 DIAGNOSIS — G4733 Obstructive sleep apnea (adult) (pediatric): Secondary | ICD-10-CM

## 2021-06-01 DIAGNOSIS — I4891 Unspecified atrial fibrillation: Secondary | ICD-10-CM

## 2021-06-01 LAB — ECHOCARDIOGRAM COMPLETE
AR max vel: 1.94 cm2
AV Area VTI: 1.87 cm2
AV Area mean vel: 1.8 cm2
AV Mean grad: 5 mmHg
AV Peak grad: 10 mmHg
Ao pk vel: 1.58 m/s
Area-P 1/2: 3.58 cm2
Height: 69 in
MV VTI: 2.12 cm2
S' Lateral: 2.8 cm
Weight: 3088 oz

## 2021-06-01 LAB — CBC WITH DIFFERENTIAL/PLATELET
Abs Immature Granulocytes: 0.03 10*3/uL (ref 0.00–0.07)
Basophils Absolute: 0 10*3/uL (ref 0.0–0.1)
Basophils Relative: 0 %
Eosinophils Absolute: 0.1 10*3/uL (ref 0.0–0.5)
Eosinophils Relative: 1 %
HCT: 38.4 % — ABNORMAL LOW (ref 39.0–52.0)
Hemoglobin: 12.8 g/dL — ABNORMAL LOW (ref 13.0–17.0)
Immature Granulocytes: 0 %
Lymphocytes Relative: 28 %
Lymphs Abs: 2.1 10*3/uL (ref 0.7–4.0)
MCH: 29.2 pg (ref 26.0–34.0)
MCHC: 33.3 g/dL (ref 30.0–36.0)
MCV: 87.7 fL (ref 80.0–100.0)
Monocytes Absolute: 0.7 10*3/uL (ref 0.1–1.0)
Monocytes Relative: 8 %
Neutro Abs: 4.8 10*3/uL (ref 1.7–7.7)
Neutrophils Relative %: 63 %
Platelets: 267 10*3/uL (ref 150–400)
RBC: 4.38 MIL/uL (ref 4.22–5.81)
RDW: 13.6 % (ref 11.5–15.5)
WBC: 7.7 10*3/uL (ref 4.0–10.5)
nRBC: 0 % (ref 0.0–0.2)

## 2021-06-01 LAB — URINALYSIS, ROUTINE W REFLEX MICROSCOPIC
Bilirubin Urine: NEGATIVE
Glucose, UA: NEGATIVE mg/dL
Hgb urine dipstick: NEGATIVE
Ketones, ur: 15 mg/dL — AB
Leukocytes,Ua: NEGATIVE
Nitrite: NEGATIVE
Protein, ur: NEGATIVE mg/dL
Specific Gravity, Urine: 1.015 (ref 1.005–1.030)
pH: 6.5 (ref 5.0–8.0)

## 2021-06-01 LAB — COMPREHENSIVE METABOLIC PANEL
ALT: 16 U/L (ref 0–44)
AST: 14 U/L — ABNORMAL LOW (ref 15–41)
Albumin: 3.7 g/dL (ref 3.5–5.0)
Alkaline Phosphatase: 44 U/L (ref 38–126)
Anion gap: 6 (ref 5–15)
BUN: 13 mg/dL (ref 6–20)
CO2: 26 mmol/L (ref 22–32)
Calcium: 8.3 mg/dL — ABNORMAL LOW (ref 8.9–10.3)
Chloride: 107 mmol/L (ref 98–111)
Creatinine, Ser: 0.69 mg/dL (ref 0.61–1.24)
GFR, Estimated: >60 mL/min (ref >60)
Glucose, Bld: 96 mg/dL (ref 70–99)
Potassium: 3.7 mmol/L (ref 3.5–5.1)
Sodium: 139 mmol/L (ref 135–145)
Total Bilirubin: 0.8 mg/dL (ref 0.3–1.2)
Total Protein: 6.3 g/dL — ABNORMAL LOW (ref 6.5–8.1)

## 2021-06-01 LAB — MRSA NEXT GEN BY PCR, NASAL: MRSA by PCR Next Gen: NOT DETECTED

## 2021-06-01 LAB — MAGNESIUM: Magnesium: 1.9 mg/dL (ref 1.7–2.4)

## 2021-06-01 LAB — TSH: TSH: 1.514 u[IU]/mL (ref 0.350–4.500)

## 2021-06-01 LAB — D-DIMER, QUANTITATIVE: D-Dimer, Quant: <0.27 ug/mL-FEU (ref 0.00–0.50)

## 2021-06-01 LAB — HIV ANTIBODY (ROUTINE TESTING W REFLEX): HIV Screen 4th Generation wRfx: NONREACTIVE

## 2021-06-01 MED ORDER — ACETAMINOPHEN 325 MG PO TABS: 650.0000 mg | ORAL_TABLET | Freq: Four times a day (QID) | ORAL | Status: DC | PRN

## 2021-06-01 MED ORDER — ONDANSETRON HCL 4 MG/2ML IJ SOLN: 4.0000 mg | Freq: Four times a day (QID) | INTRAMUSCULAR | Status: DC | PRN

## 2021-06-01 MED ORDER — HEPARIN SODIUM (PORCINE) 5000 UNIT/ML IJ SOLN
5000.0000 [IU] | Freq: Three times a day (TID) | INTRAMUSCULAR | Status: DC
  Administered 2021-06-01 – 2021-06-02 (×4): 5000 [IU] via SUBCUTANEOUS
  Filled 2021-06-01 (×4): qty 1

## 2021-06-01 MED ORDER — CARVEDILOL 3.125 MG PO TABS: 6.2500 mg | ORAL_TABLET | Freq: Two times a day (BID) | ORAL | Status: DC

## 2021-06-01 MED ORDER — ACETAMINOPHEN 650 MG RE SUPP: 650.0000 mg | Freq: Four times a day (QID) | RECTAL | Status: DC | PRN

## 2021-06-01 MED ORDER — CHLORHEXIDINE GLUCONATE CLOTH 2 % EX PADS
6.0000 | MEDICATED_PAD | Freq: Every day | CUTANEOUS | Status: DC
  Administered 2021-06-01: 6 via TOPICAL

## 2021-06-01 MED ORDER — OXYCODONE HCL 5 MG PO TABS: 5.0000 mg | ORAL_TABLET | ORAL | Status: DC | PRN

## 2021-06-01 MED ORDER — METOPROLOL TARTRATE 25 MG PO TABS
25.0000 mg | ORAL_TABLET | Freq: Two times a day (BID) | ORAL | Status: DC
  Administered 2021-06-01 – 2021-06-02 (×3): 25 mg via ORAL
  Filled 2021-06-01 (×3): qty 1

## 2021-06-01 MED ORDER — ONDANSETRON HCL 4 MG PO TABS: 4.0000 mg | ORAL_TABLET | Freq: Four times a day (QID) | ORAL | Status: DC | PRN

## 2021-06-01 MED ORDER — IRBESARTAN 150 MG PO TABS: 150.0000 mg | ORAL_TABLET | Freq: Every day | ORAL | Status: DC

## 2021-06-01 MED ORDER — MORPHINE SULFATE (PF) 2 MG/ML IV SOLN: 2.0000 mg | INTRAVENOUS | Status: DC | PRN

## 2021-06-01 MED ORDER — ASPIRIN 325 MG PO TABS
325.0000 mg | ORAL_TABLET | Freq: Every day | ORAL | Status: DC
Start: 2021-06-01 — End: 2021-06-02
  Administered 2021-06-01 – 2021-06-02 (×2): 325 mg via ORAL
  Filled 2021-06-01 (×2): qty 1

## 2021-06-01 NOTE — Progress Notes (Signed)
Patient seen and examined. Admitted after midnight secondary to palpitations and nausea. Patient was found with new onset of A. Fib with RVR. Currently with improvement in his rate after initiation of Cardizem drip. No CP, no abd pain, no fever. BP soft but stable. Please refer to H&P written by Dr. Clearence Ped for further info/details on admission.  Plan: -stop olmesartan to give more room to rate control agents. -follow echo, and maintain electrolytes stable. -start lopressor -continue to maintain adequate hydration  -continue aspirin and DVT prophylaxis. -outpatient follow up with cardiology service.  Barton Dubois MD [phone]

## 2021-06-01 NOTE — H&P (Addendum)
TRH H&P    Patient Demographics:    Evan Henry, is a 50 y.o. male  MRN: [id number]  DOB - 13-Jul-1970  Admit Date - 05/31/2021  Referring MD/NP/PA: Roderic Palau  Outpatient Primary MD for the patient is [name], MD  Patient coming from: home  Chief complaint- palpitations   HPI:    Evan Henry  is a 50 y.o. male, with history of scoliosis, hypertension, multiple back injuries, presents the ED with a chief complaint of palpitations.  Patient reports that he had just come off of a 24-hour religious fast.  He took his blood pressure medication olmesartan 5 mg, Tylenol for headache, and started eating a [employer] sandwich.  He started feeling really bad.  He reports that he checked his glucose which was 105.  He checked his blood pressure which was normal.  He then became nauseous and his wife told him that he was pale.  He he did have 1 episode of nonbloody emesis.  After that he had palpitations.  He went back and checked his blood pressure again which showed that his heart rate was 130.  He denies any chest pain during that time but does report palpitations and tachypnea.  Patient reports that due to the high heart rate they called EMS.  Patient reports that the palpitations lasted until he was given medication in the ED.  Recently patient has complained of dysuria.  He went to his PCP and had a UA done that did not not indicate a UTI.  He is also had 4 weeks worth of pain in his groin.  The pain is in the right testicle, and perineum.  He saw a physician regarding this to and they told him that it was an irritated nerve.  He reports that that pain is worse when sitting or walking.  It is a sharp pain.  He has no history of hemorrhoids.  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  He was concerned about a blood clot as he has a family history of DVT.  Patient has no personal history of DVT.  Patient has  no other complaints at this time.  Patient does not smoke, does not drink alcohol, does not use illicit drugs.  He is not vaccinated for COVID.  Patient is full code.  In the ED Temp 37.5, heart rate 118-154, respiratory rate 13-29, blood pressure 141/117, satting 98% Leukocytosis at 13.6, hemoglobin 14.3 Chemistry was unremarkable COVID and flu pending Chest x-ray shows no active disease EKG shows a heart rate of 146, A. fib, QTC 479 The time of my exam heart rate down to 84, still A. Fib Patient was given 10 mg of diltiazem, 5 mg of Lopressor, 2 L bolus, and then started on a Cardizem drip Admission requested for work-up of new onset atrial fibrillation    Review of systems:    In addition to the HPI above,  No Fever-chills, No Headache, No changes with Vision or hearing, No problems swallowing food or Liquids, No Chest pain, Cough  No Abdominal pain,  bowel movements are regular, No Blood in stool or Urine, No new skin rashes or bruises, No new joints pains-aches,  No new weakness, tingling, numbness in any extremity, No recent weight gain or loss, No polyuria, polydypsia or polyphagia, No significant Mental Stressors.  All other systems reviewed and are negative.    Past History of the following :    Past Medical History:  Diagnosis Date   Acute bronchitis 12/21/2009   Qualifier: History of  By: Annamaria Boots MD, Clinton D    ALLERGIC RHINITIS 12/25/2009   Qualifier: Diagnosis of  By: Annamaria Boots MD, Clinton D    Cervical herniated disc 07/28/2017   Overview:  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 Earlean Shawl - q 5 years colonoscopy. Last 2013/cla   Varicocele 04/19/2013   Overview:  Dr. Karsten Ro      Past Surgical History:  Procedure Laterality Date   WISDOM TOOTH EXTRACTION        Social History:      Social History   Tobacco Use   Smoking status: Never   Smokeless tobacco: Never  Substance Use Topics   Alcohol use: Yes    Comment: occ       Family History :     Family History  Problem Relation Age of Onset   Emphysema Mother    Heart attack Father 44   Heart attack Brother 80   Stroke Brother       Home Medications:   Prior to Admission medications   Medication Sig Start Date End Date Taking? Authorizing Provider  carvedilol (COREG) 6.25 MG tablet Take 1 tablet (6.25 mg total) by mouth 2 (two) times daily. 07/28/19   [name], MD  valsartan (DIOVAN) 160 MG tablet TAKE 1 TABLET BY MOUTH EVERY DAY 07/11/19   [name], MD     Allergies:     Allergies  Allergen Reactions   Ciprofloxacin Other (See Comments)    Joint pain    Levofloxacin Other (See Comments)    Joint pain    Meloxicam Nausea Only   Sulfa Antibiotics Nausea And Vomiting and Rash     Physical Exam:   Vitals  Blood pressure 90/64, pulse 81, temperature 97.9 F (36.6 C), temperature source Oral, resp. rate 17, height 5\' 9"  (1.753 m), weight 87.5 kg, SpO2 98 %.   1.  General: Patient lying supine in bed, head of bed elevated, no acute distress   2. Psychiatric: Alert and oriented x 3, mood and behavior normal for situation, pleasant and cooperative with exam   3. Neurologic: Speech and language are normal, face is symmetric, moves all 4 extremities voluntarily, at baseline without acute deficits on limited exam   4. HEENMT:  Head is atraumatic, normocephalic, pupils reactive to light, neck is supple,  trachea is midline, mucous membranes are moist   5. Respiratory : Lungs are clear to auscultation bilaterally without wheezing, rhonchi, rales, no cyanosis, no increase in work of breathing or accessory  muscle use   6. Cardiovascular : Heart rate normal, rhythm is irregularly irregular, no murmurs, rubs or gallops, no peripheral edema, peripheral pulses palpated   7. Gastrointestinal:  Abdomen is soft, nondistended, nontender to palpation bowel sounds active, no masses or organomegaly palpated   8. Skin:  Skin is warm, dry and intact without rashes, acute lesions, or ulcers on limited exam   9.Musculoskeletal:  No acute deformities or trauma, no asymmetry in tone, no peripheral edema, peripheral pulses palpated, no tenderness to palpation in the extremities     Data Review:    CBC Recent Labs  Lab 05/31/21 2045  WBC 13.6*  HGB 14.3  HCT 41.9  PLT 262  MCV 85.3  MCH 29.1  MCHC 34.1  RDW 13.2  LYMPHSABS 0.8  MONOABS 0.5  EOSABS 0.0  BASOSABS 0.0   ------------------------------------------------------------------------------------------------------------------  Results for orders placed or performed during the hospital encounter of 05/31/21 (from the past 48 hour(s))  CBC with Differential/Platelet     Status: Abnormal   Collection Time: 05/31/21  8:45 PM  Result Value Ref Range   WBC 13.6 (H) 4.0 - 10.5 K/uL   RBC 4.91 4.22 - 5.81 MIL/uL   Hemoglobin 14.3 13.0 - 17.0 g/dL   HCT 41.9 39.0 - 52.0 %   MCV 85.3 80.0 - 100.0 fL   MCH 29.1 26.0 - 34.0 pg   MCHC 34.1 30.0 - 36.0 g/dL   RDW 13.2 11.5 - 15.5 %   Platelets 262 150 - 400 K/uL   nRBC 0.0 0.0 - 0.2 %   Neutrophils Relative % 89 %   Neutro Abs 12.2 (H) 1.7 - 7.7 K/uL   Lymphocytes Relative 6 %   Lymphs Abs 0.8 0.7 - 4.0 K/uL   Monocytes Relative 4 %   Monocytes Absolute 0.5 0.1 - 1.0 K/uL   Eosinophils Relative 0 %   Eosinophils Absolute 0.0 0.0 - 0.5 K/uL   Basophils Relative 0 %   Basophils Absolute  0.0 0.0 - 0.1 K/uL   Immature Granulocytes 1 %   Abs Immature Granulocytes 0.08 (H) 0.00 - 0.07 K/uL    Comment: Performed at [facility], [address] [place], [place] [address]  Comprehensive metabolic panel     Status: Abnormal   Collection Time: 05/31/21  8:45 PM  Result Value Ref Range   Sodium 135 135 - 145 mmol/L   Potassium 3.9 3.5 - 5.1 mmol/L   Chloride 101 98 - 111 mmol/L   CO2 24 22 - 32 mmol/L   Glucose, Bld 131 (H) 70 - 99 mg/dL    Comment: Glucose reference range applies only to samples taken after fasting for at least 8 hours.   BUN 16 6 - 20 mg/dL   Creatinine, Ser 0.71 0.61 - 1.24 mg/dL   Calcium 8.8 (L) 8.9 - 10.3 mg/dL   Total Protein 7.2 6.5 - 8.1 g/dL   Albumin 4.4 3.5 - 5.0 g/dL   AST 16 15 - 41 U/L   ALT 19 0 - 44 U/L   Alkaline Phosphatase 53 38 - 126 U/L   Total Bilirubin 1.1 0.3 - 1.2 mg/dL   GFR, Estimated >60 >60 mL/min    Comment: (NOTE) Calculated using the CKD-EPI Creatinine Equation (2021)    Anion gap 10 5 - 15    Comment: Performed at [facility], [address], [place], [place] [address]  Troponin I (High Sensitivity)     Status: None   Collection Time: 05/31/21  8:45  PM  Result Value Ref Range   Troponin I (High Sensitivity) 3 <18 ng/L    Comment: (NOTE) Elevated high sensitivity troponin I (hsTnI) values and significant  changes across serial measurements may suggest ACS but many other  chronic and acute conditions are known to elevate hsTnI results.  Refer to the "Links" section for chest pain algorithms and additional  guidance. Performed at [facility], [address], [place], [place] [address]   D-dimer, quantitative     Status: None   Collection Time: 05/31/21  9:03 PM  Result Value Ref Range   D-Dimer, Quant <0.27 0.00 - 0.50 ug/mL-FEU    Comment: (NOTE) At the manufacturer cut-off value of 0.5 g/mL FEU, this assay has a negative predictive value of 95-100%.This assay is intended for use in conjunction with a clinical  pretest probability (PTP) assessment model to exclude pulmonary embolism (PE) and deep venous thrombosis (DVT) in outpatients suspected of PE or DVT. Results should be correlated with clinical presentation. Performed at [facility], [address] [place],  [address]   Resp Panel by RT-PCR (Flu A&B, Covid) Nasopharyngeal Swab     Status: None   Collection Time: 05/31/21 10:02 PM   Specimen: Nasopharyngeal Swab; Nasopharyngeal(NP) swabs in vial transport medium  Result Value Ref Range   SARS Coronavirus 2 by RT PCR NEGATIVE NEGATIVE    Comment: (NOTE) SARS-CoV-2 target nucleic acids are NOT DETECTED.  The SARS-CoV-2 RNA is generally detectable in upper respiratory specimens during the acute phase of infection. The lowest concentration of SARS-CoV-2 viral copies this assay can detect is 138 copies/mL. A negative result does not preclude SARS-Cov-2 infection and should not be used as the sole basis for treatment or other patient management decisions. A negative result may occur with  improper specimen collection/handling, submission of specimen other than nasopharyngeal swab, presence of viral mutation(s) within the areas targeted by this assay, and inadequate number of viral copies(<138 copies/mL). A negative result must be combined with clinical observations, patient history, and epidemiological information. The expected result is Negative.  Fact Sheet for Patients:  EntrepreneurPulse.com.au  Fact Sheet for Healthcare Providers:  IncredibleEmployment.be  This test is no t yet approved or cleared by the [place] FDA and  has been authorized for detection and/or diagnosis of SARS-CoV-2 by FDA under an Emergency Use Authorization (EUA). This EUA will remain  in effect (meaning this test can be used) for the duration of the COVID-19 declaration under Section 564(b)(1) of the Act, 21 U.S.C.section 360bbb-3(b)(1), unless the authorization  is terminated  or revoked sooner.       Influenza A by PCR NEGATIVE NEGATIVE   Influenza B by PCR NEGATIVE NEGATIVE    Comment: (NOTE) The Xpert Xpress SARS-CoV-2/FLU/RSV plus assay is intended as an aid in the diagnosis of influenza from Nasopharyngeal swab specimens and should not be used as a sole basis for treatment. Nasal washings and aspirates are unacceptable for Xpert Xpress SARS-CoV-2/FLU/RSV testing.  Fact Sheet for Patients: EntrepreneurPulse.com.au  Fact Sheet for Healthcare Providers: IncredibleEmployment.be  This test is not yet approved or cleared by the [place] FDA and has been authorized for detection and/or diagnosis of SARS-CoV-2 by FDA under an Emergency Use Authorization (EUA). This EUA will remain in effect (meaning this test can be used) for the duration of the COVID-19 declaration under Section 564(b)(1) of the Act, 21 U.S.C. section 360bbb-3(b)(1), unless the authorization is terminated or revoked.  Performed at [facility], [address] [place], [place]  [address]   Troponin I (High Sensitivity)     Status: None   Collection Time: 05/31/21 10:50 PM  Result Value Ref Range   Troponin I (High Sensitivity) 4 <18 ng/L    Comment: (NOTE) Elevated high sensitivity troponin I (hsTnI) values and significant  changes across serial measurements may suggest ACS but many other  chronic and acute conditions are known to elevate hsTnI results.  Refer to the "Links" section for chest pain algorithms and additional  guidance. Performed at [facility], [address] [place],  [address]     Chemistries  Recent Labs  Lab 05/31/21 2045  NA 135  K 3.9  CL 101  CO2 24  GLUCOSE 131*  BUN 16  CREATININE 0.71  CALCIUM 8.8*  AST 16  ALT 19  ALKPHOS 53  BILITOT 1.1    ------------------------------------------------------------------------------------------------------------------  ------------------------------------------------------------------------------------------------------------------ GFR: Estimated Creatinine Clearance: 122.3 mL/min (by C-G formula based on SCr of 0.71 mg/dL). Liver Function Tests: Recent Labs  Lab 05/31/21 2045  AST 16  ALT 19  ALKPHOS 53  BILITOT 1.1  PROT 7.2  ALBUMIN 4.4   No results for input(s): LIPASE, AMYLASE in the last 168 hours. No results for input(s): AMMONIA in the last 168 hours. Coagulation Profile: No results for input(s): INR, PROTIME in the last 168 hours. Cardiac Enzymes: No results for input(s): CKTOTAL, CKMB, CKMBINDEX, TROPONINI in the last 168 hours. BNP (last 3 results) No results for input(s): PROBNP in the last 8760 hours. HbA1C: No results for input(s): HGBA1C in the last 72 hours. CBG: No results for input(s): GLUCAP in the last 168 hours. Lipid Profile: No results for input(s): CHOL, HDL, LDLCALC, TRIG, CHOLHDL, LDLDIRECT in the last 72 hours. Thyroid Function Tests: No results for input(s): TSH, T4TOTAL, FREET4, T3FREE, THYROIDAB in the last 72 hours. Anemia Panel: No results for input(s): VITAMINB12, FOLATE, FERRITIN, TIBC, IRON, RETICCTPCT in the last 72 hours.  --------------------------------------------------------------------------------------------------------------- Urine analysis:    Component Value Date/Time   COLORURINE YELLOW 01/07/2019 0438   APPEARANCEUR HAZY (A) 01/07/2019 0438   LABSPEC 1.016 01/07/2019 0438   PHURINE 6.0 01/07/2019 0438   GLUCOSEU NEGATIVE 01/07/2019 0438   HGBUR NEGATIVE 01/07/2019 0438   BILIRUBINUR NEGATIVE 01/07/2019 0438   KETONESUR NEGATIVE 01/07/2019 0438   PROTEINUR 30 (A) 01/07/2019 0438   UROBILINOGEN 0.2 07/30/2008 2031   NITRITE NEGATIVE 01/07/2019 0438   LEUKOCYTESUR NEGATIVE 01/07/2019 0438      Imaging Results:     DG Chest Port 1 View  Result Date: 05/31/2021 CLINICAL DATA:  Weakness. EXAM: PORTABLE CHEST 1 VIEW COMPARISON:  Chest x-ray 08/25/2017. FINDINGS: The heart size and mediastinal contours are within normal limits. Both lungs are clear. The visualized skeletal structures are unremarkable. IMPRESSION: No active disease. Electronically Signed   By: Ronney Asters M.D.   On: 05/31/2021 21:04      Assessment & Plan:    Principal Problem:   Atrial fibrillation with RVR ([place]) Active Problems:   Hypertension   New onset atrial fibrillation CHA2DS2-VASc 1 Start aspirin daily Continue diltiazem drip for rate control Given family history of blood clots, check a D-dimer to rule out PE Echo to rule out structural etiologies Electrolytes are within normal limits on chemistry, repeat electrolytes and check magnesium in a.m. Check a TSH Troponin normal at 3 and 4 Hemoglobin stable at 14.3 Chest x-ray has no sign of infection, check UA Monitor on telemetry Hypertension Continue ARB Continue to monitor OSA Continue CPAP QHS   DVT Prophylaxis-  Heparin - SCDs   AM Labs Ordered, also please review Full Orders  Family Communication: Admission, patients condition and plan of care including tests being ordered have been discussed with the patient and wife who indicate understanding and agree with the plan and Code Status.  Code Status: Full  Admission status: Inpatient :The appropriate admission status for this patient is INPATIENT. Inpatient status is judged to be reasonable and necessary in order to provide the required intensity of service to ensure the patient's safety. The patient's presenting symptoms, physical exam findings, and initial radiographic and laboratory data in the context of their chronic comorbidities is felt to place them at high risk for further clinical deterioration. Furthermore, it is not anticipated that the patient will be medically stable for discharge from the hospital  within 2 midnights of admission. The following factors support the admission status of inpatient.     The patient's presenting symptoms include palpitations. The worrisome physical exam findings include tachycardia, irregularly irregular rhythm. The initial radiographic and laboratory data are worrisome because of EKG with a rate of 146, A. fib RVR. The chronic co-morbidities include hypertension.       * I certify that at the point of admission it is my clinical judgment that the patient will require inpatient hospital care spanning beyond 2 midnights from the point of admission due to high intensity of service, high risk for further deterioration and high frequency of surveillance required.*  Disposition: Anticipated Discharge date 48 hours discharge to home  Time spent in minutes : [place] DO

## 2021-06-01 NOTE — Progress Notes (Signed)
*  PRELIMINARY RESULTS* Echocardiogram 2D Echocardiogram has been performed.  Evan Henry 06/01/2021, 9:37 AM

## 2021-06-01 NOTE — Plan of Care (Signed)

## 2021-06-01 NOTE — Progress Notes (Signed)
Took CPAP to patient's room and hooked it up, but when patient tried it, he did not feel like it worked as well for him as his home CPAP.  Patient called wife to bring his CPAP to the hospital.  RN made aware.

## 2021-06-01 NOTE — Progress Notes (Signed)
Patient has home CPAP in room and self manages machine.

## 2021-06-02 DIAGNOSIS — E876 Hypokalemia: Secondary | ICD-10-CM

## 2021-06-02 LAB — BASIC METABOLIC PANEL
Anion gap: 5 (ref 5–15)
BUN: 14 mg/dL (ref 6–20)
CO2: 27 mmol/L (ref 22–32)
Calcium: 8.5 mg/dL — ABNORMAL LOW (ref 8.9–10.3)
Chloride: 105 mmol/L (ref 98–111)
Creatinine, Ser: 0.84 mg/dL (ref 0.61–1.24)
GFR, Estimated: >60 mL/min (ref >60)
Glucose, Bld: 123 mg/dL — ABNORMAL HIGH (ref 70–99)
Potassium: 3.3 mmol/L — ABNORMAL LOW (ref 3.5–5.1)
Sodium: 137 mmol/L (ref 135–145)

## 2021-06-02 LAB — MAGNESIUM: Magnesium: 1.9 mg/dL (ref 1.7–2.4)

## 2021-06-02 MED ORDER — DILTIAZEM HCL ER COATED BEADS 120 MG PO CP24: 120.0000 mg | ORAL_CAPSULE | Freq: Every day | ORAL | 2 refills | Status: DC

## 2021-06-02 MED ORDER — PANTOPRAZOLE SODIUM 40 MG PO TBEC: 40.0000 mg | DELAYED_RELEASE_TABLET | Freq: Every day | ORAL | 2 refills | Status: AC

## 2021-06-02 MED ORDER — DILTIAZEM HCL ER COATED BEADS 120 MG PO CP24
120.0000 mg | ORAL_CAPSULE | Freq: Every day | ORAL | Status: DC
  Administered 2021-06-02: 120 mg via ORAL
  Filled 2021-06-02: qty 1

## 2021-06-02 MED ORDER — POTASSIUM CHLORIDE CRYS ER 20 MEQ PO TBCR
40.0000 meq | EXTENDED_RELEASE_TABLET | ORAL | Status: DC
  Administered 2021-06-02: 40 meq via ORAL
  Filled 2021-06-02: qty 2

## 2021-06-02 MED ORDER — METOPROLOL TARTRATE 25 MG PO TABS: 25.0000 mg | ORAL_TABLET | Freq: Two times a day (BID) | ORAL | 2 refills | Status: DC

## 2021-06-02 MED ORDER — ASPIRIN EC 81 MG PO TBEC: 81.0000 mg | DELAYED_RELEASE_TABLET | Freq: Every day | ORAL | 5 refills | Status: AC

## 2021-06-02 NOTE — Discharge Summary (Signed)
Physician Discharge Summary  Evan Henry DGU:[id number] DOB: [birth year] DOA: 05/31/2021  PCP: [name], MD  Admit date: 05/31/2021 Discharge date: 06/02/2021  Time spent: 35 minutes  Recommendations for Outpatient Follow-up:  Reassess blood pressure and further adjust antihypertensive as needed Repeat basic metabolic panel to follow electrolytes and renal function. Outpatient follow up with cardiology service.  Discharge Diagnoses:  Principal Problem:   Atrial fibrillation with RVR ([place]) Active Problems:   Hypertension   Hypokalemia OSA on CPAP  Discharge Condition: Stable and improved.  Discharged home with instruction to follow-up with PCP and cardiology as an outpatient.  CODE STATUS: Full code.  Diet recommendation: Heart healthy diet.  Filed Weights   05/31/21 2025 06/02/21 0349  Weight: 87.5 kg 88.8 kg    History of present illness:  As per H&P written by Dr. Clearence Ped on 06/01/21 Evan Henry  is a 50 y.o. male, with history of scoliosis, hypertension, multiple back injuries, presents the ED with a chief complaint of palpitations.  Patient reports that he had just come off of a 24-hour religious fast.  He took his blood pressure medication olmesartan 5 mg, Tylenol for headache, and started eating a [employer] sandwich.  He started feeling really bad.  He reports that he checked his glucose which was 105.  He checked his blood pressure which was normal.  He then became nauseous and his wife told him that he was pale.  He he did have 1 episode of nonbloody emesis.  After that he had palpitations.  He went back and checked his blood pressure again which showed that his heart rate was 130.  He denies any chest pain during that time but does report palpitations and tachypnea.  Patient reports that due to the high heart rate they called EMS.  Patient reports that the palpitations lasted until he was given medication in the ED.  Recently patient has complained of dysuria.   He went to his PCP and had a UA done that did not not indicate a UTI.  He is also had 4 weeks worth of pain in his groin.  The pain is in the right testicle, and perineum.  He saw a physician regarding this to and they told him that it was an irritated nerve.  He reports that that pain is worse when sitting or walking.  It is a sharp pain.  He has no history of hemorrhoids.  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  He was concerned about a blood clot as he has a family history of DVT.  Patient has no personal history of DVT.  Patient has no other complaints at this time.   Patient does not smoke, does not drink alcohol, does not use illicit drugs.  He is not vaccinated for COVID.  Patient is full code.   In the ED Temp 37.5, heart rate 118-154, respiratory rate 13-29, blood pressure 141/117, satting 98% Leukocytosis at 13.6, hemoglobin 14.3 Chemistry was unremarkable COVID and flu pending Chest x-ray shows no active disease EKG shows a heart rate of 146, A. fib, QTC 479 The time of my exam heart rate down to 84, still A. Fib Patient was given 10 mg of diltiazem, 5 mg of Lopressor, 2 L bolus, and then started on a Cardizem drip Admission requested for work-up of new onset atrial fibrillation  Hospital Course:  1-New onset A. Fib -CHADSVASC score 1 -2D echo demonstrating preserved ejection fraction and no significant valvular abnormality.  No wall motion disorders. -Cardiology has been curbside with recommendations for aspirin and continue rate control strategy with Metoprolol and Cardizem. -Normal TSH and D-dimer -Hemodynamically stable and at discharge has flipped back to sinus rhythm.  2-OSA -Continue the use of CPAP.  3-hypertension -Blood pressure stable and well-controlled with the use of Cardizem and metoprolol currently -Advised to follow heart healthy diet. -Pressure follow-up visit.  4-GI prophylaxis -Patient has been discharged on Protonix on daily  basis. -Advised to avoid the use of NSAIDs.  5-hypokalemia -Repleted and within normal limits at discharge. -Repeat basic metabolic panel follow-up visit to assess electrolytes trend.  Procedures: See below for x-ray 2D echo: 1. Left ventricular ejection fraction, by estimation, is 60 to 65%. The  left ventricle has normal function. The left ventricle has no regional  wall motion abnormalities. Left ventricular diastolic parameters are  indeterminate.   2. Right ventricular systolic function is normal. The right ventricular  size is normal. There is normal pulmonary artery systolic pressure.   3. The mitral valve is normal in structure. No evidence of mitral valve  regurgitation. No evidence of mitral stenosis.   4. The aortic valve is tricuspid. There is mild calcification of the  aortic valve. Aortic valve regurgitation is not visualized. Aortic valve  sclerosis is present, with no evidence of aortic valve stenosis.   5. The inferior vena cava is normal in size with greater than 50%  respiratory variability, suggesting right atrial pressure of 3 mmHg.   Consultations: Cardiology curbside (recommended continue rate control strategy, aspirin (CHADSVASC score 1) and outpatient follow up)  Discharge Exam: Vitals:   06/02/21 0800 06/02/21 0801  BP: 112/81 112/81  Pulse: 73 81  Resp: 16 17  Temp:  97.8 F (36.6 C)  SpO2: 97% 98%    General: Afebrile, no chest pain, no nausea, no vomiting, reports no palpitations.  Patient is afebrile and ready to go home. Cardiovascular: S1 and S2, currently sinus rhythm; no rubs, no gallops, no murmurs, no JVD. Respiratory: CTA bilaterally, no using accessory muscles. Abdomen: soft, NT, ND, positive BS. Extremities: no edema, no cyanosis, no clubbing.   Discharge Instructions   Discharge Instructions     Diet - low sodium heart healthy   Complete by: As directed    Discharge instructions   Complete by: As directed    Take medications  as prescribed Follow heart healthy diet Follow-up with cardiology service instructed Follow-up with PCP in 10 days. Continue adequate hydration.   Increase activity slowly   Complete by: As directed       Allergies as of 06/02/2021       Reactions   Ciprofloxacin Other (See Comments)   Joint pain    Levofloxacin Other (See Comments)   Joint pain    Meloxicam Nausea Only   Sulfa Antibiotics Nausea And Vomiting, Rash        Medication List     STOP taking these medications    olmesartan 5 MG tablet Commonly known as: BENICAR       TAKE these medications    aspirin EC 81 MG tablet Take 1 tablet (81 mg total) by mouth daily. Swallow whole.   diltiazem 120 MG 24 hr capsule Commonly known as: CARDIZEM CD Take 1 capsule (120 mg total) by mouth daily. Start taking on: June 03, 2021   metoprolol tartrate 25 MG tablet Commonly known as: LOPRESSOR Take 1 tablet (25 mg total) by mouth 2 (two) times daily.   pantoprazole 40  MG tablet Commonly known as: Protonix Take 1 tablet (40 mg total) by mouth daily.       Allergies  Allergen Reactions   Ciprofloxacin Other (See Comments)    Joint pain    Levofloxacin Other (See Comments)    Joint pain    Meloxicam Nausea Only   Sulfa Antibiotics Nausea And Vomiting and Rash    Follow-up Information     Arnoldo Lenis, MD Follow up in 10 day(s).   Specialty: Cardiology Why: office will contact you with appointment details. Contact information: [address] [place] [address] [phone]         [name], MD. Schedule an appointment as soon as possible for a visit in 2 week(s).   Specialty: Family Medicine Contact information: [place] [address] [phone]                 The results of significant diagnostics from this hospitalization (including imaging, microbiology, ancillary and laboratory) are listed below for reference.    Significant  Diagnostic Studies: DG Chest Port 1 View  Result Date: 05/31/2021 CLINICAL DATA:  Weakness. EXAM: PORTABLE CHEST 1 VIEW COMPARISON:  Chest x-ray 08/25/2017. FINDINGS: The heart size and mediastinal contours are within normal limits. Both lungs are clear. The visualized skeletal structures are unremarkable. IMPRESSION: No active disease. Electronically Signed   By: Ronney Asters M.D.   On: 05/31/2021 21:04   ECHOCARDIOGRAM COMPLETE  Result Date: 06/01/2021    ECHOCARDIOGRAM REPORT   Patient Name:   Evan Henry Date of Exam: 06/01/2021 Medical Rec #:  [id number]     Height:       69.0 in Accession #:    [id number]    Weight:       193.0 lb Date of Birth:  1971/06/12     BSA:          2.035 m Patient Age:    67 years      BP:           90/64 mmHg Patient Gender: M             HR:           133 bpm. Exam Location:  [facility] Procedure: 2D Echo, Cardiac Doppler and Color Doppler Indications:    Atrial Fibrillation  History:        Patient has prior history of Echocardiogram examinations, most                 recent 01/27/2019. Arrythmias:Atrial Fibrillation,                 Signs/Symptoms:Chest Pain; Risk Factors:Hypertension.  Sonographer:    Wenda Low Referring Phys: [id number] ASIA B [place]  1. Left ventricular ejection fraction, by estimation, is 60 to 65%. The left ventricle has normal function. The left ventricle has no regional wall motion abnormalities. Left ventricular diastolic parameters are indeterminate.  2. Right ventricular systolic function is normal. The right ventricular size is normal. There is normal pulmonary artery systolic pressure.  3. The mitral valve is normal in structure. No evidence of mitral valve regurgitation. No evidence of mitral stenosis.  4. The aortic valve is tricuspid. There is mild calcification of the aortic valve. Aortic valve regurgitation is not visualized. Aortic valve sclerosis is present, with no evidence of aortic valve stenosis.  5. The  inferior vena cava is normal in size with greater than 50% respiratory variability, suggesting  right atrial pressure of 3 mmHg. FINDINGS  Left Ventricle: Left ventricular ejection fraction, by estimation, is 60 to 65%. The left ventricle has normal function. The left ventricle has no regional wall motion abnormalities. The left ventricular internal cavity size was normal in size. There is  no left ventricular hypertrophy. Left ventricular diastolic parameters are indeterminate. Right Ventricle: The right ventricular size is normal. No increase in right ventricular wall thickness. Right ventricular systolic function is normal. There is normal pulmonary artery systolic pressure. The tricuspid regurgitant velocity is 2.73 m/s, and  with an assumed right atrial pressure of 3 mmHg, the estimated right ventricular systolic pressure is 66.4 mmHg. Left Atrium: Left atrial size was normal in size. Right Atrium: Right atrial size was normal in size. Pericardium: There is no evidence of pericardial effusion. Mitral Valve: The mitral valve is normal in structure. No evidence of mitral valve regurgitation. No evidence of mitral valve stenosis. MV peak gradient, 4.8 mmHg. The mean mitral valve gradient is 2.0 mmHg. Tricuspid Valve: The tricuspid valve is normal in structure. Tricuspid valve regurgitation is trivial. No evidence of tricuspid stenosis. Aortic Valve: The aortic valve is tricuspid. There is mild calcification of the aortic valve. Aortic valve regurgitation is not visualized. Aortic valve sclerosis is present, with no evidence of aortic valve stenosis. Aortic valve mean gradient measures 5.0 mmHg. Aortic valve peak gradient measures 10.0 mmHg. Aortic valve area, by VTI measures 1.87 cm. Pulmonic Valve: The pulmonic valve was normal in structure. Pulmonic valve regurgitation is not visualized. No evidence of pulmonic stenosis. Aorta: The aortic root is normal in size and structure. Venous: The inferior vena cava is  normal in size with greater than 50% respiratory variability, suggesting right atrial pressure of 3 mmHg. IAS/Shunts: No atrial level shunt detected by color flow Doppler.  LEFT VENTRICLE PLAX 2D LVIDd:         4.20 cm   Diastology LVIDs:         2.80 cm   LV e' medial:    11.10 cm/s LV PW:         0.90 cm   LV E/e' medial:  9.2 LV IVS:        1.00 cm   LV e' lateral:   14.70 cm/s LVOT diam:     1.90 cm   LV E/e' lateral: 6.9 LV SV:         49 LV SV Index:   24 LVOT Area:     2.84 cm  RIGHT VENTRICLE RV Basal diam:  3.50 cm RV Mid diam:    2.70 cm RV S prime:     13.70 cm/s TAPSE (M-mode): 2.2 cm LEFT ATRIUM             Index        RIGHT ATRIUM           Index LA diam:        3.60 cm 1.77 cm/m   RA Area:     18.90 cm LA Vol (A2C):   43.7 ml 21.47 ml/m  RA Volume:   51.00 ml  25.06 ml/m LA Vol (A4C):   39.2 ml 19.26 ml/m LA Biplane Vol: 44.3 ml 21.77 ml/m  AORTIC VALVE                    PULMONIC VALVE AV Area (Vmax):    1.94 cm     PV Vmax:       1.02 m/s AV Area (Vmean):  1.80 cm     PV Peak grad:  4.1 mmHg AV Area (VTI):     1.87 cm AV Vmax:           158.00 cm/s AV Vmean:          94.900 cm/s AV VTI:            0.263 m AV Peak Grad:      10.0 mmHg AV Mean Grad:      5.0 mmHg LVOT Vmax:         108.00 cm/s LVOT Vmean:        60.100 cm/s LVOT VTI:          0.173 m LVOT/AV VTI ratio: 0.66  AORTA Ao Root diam: 2.70 cm MITRAL VALVE                TRICUSPID VALVE MV Area (PHT): 3.58 cm     TR Peak grad:   29.8 mmHg MV Area VTI:   2.12 cm     TR Vmax:        273.00 cm/s MV Peak grad:  4.8 mmHg MV Mean grad:  2.0 mmHg     SHUNTS MV Vmax:       1.09 m/s     Systemic VTI:  0.17 m MV Vmean:      63.5 cm/s    Systemic Diam: 1.90 cm MV Decel Time: 212 msec MV E velocity: 102.00 cm/s Jenkins Rouge MD Electronically signed by Jenkins Rouge MD Signature Date/Time: 06/01/2021/4:22:14 PM    Final     Microbiology: Recent Results (from the past 240 hour(s))  Resp Panel by RT-PCR (Flu A&B, Covid) Nasopharyngeal Swab      Status: None   Collection Time: 05/31/21 10:02 PM   Specimen: Nasopharyngeal Swab; Nasopharyngeal(NP) swabs in vial transport medium  Result Value Ref Range Status   SARS Coronavirus 2 by RT PCR NEGATIVE NEGATIVE Final    Comment: (NOTE) SARS-CoV-2 target nucleic acids are NOT DETECTED.  The SARS-CoV-2 RNA is generally detectable in upper respiratory specimens during the acute phase of infection. The lowest concentration of SARS-CoV-2 viral copies this assay can detect is 138 copies/mL. A negative result does not preclude SARS-Cov-2 infection and should not be used as the sole basis for treatment or other patient management decisions. A negative result may occur with  improper specimen collection/handling, submission of specimen other than nasopharyngeal swab, presence of viral mutation(s) within the areas targeted by this assay, and inadequate number of viral copies(<138 copies/mL). A negative result must be combined with clinical observations, patient history, and epidemiological information. The expected result is Negative.  Fact Sheet for Patients:  EntrepreneurPulse.com.au  Fact Sheet for Healthcare Providers:  IncredibleEmployment.be  This test is no t yet approved or cleared by the [place] FDA and  has been authorized for detection and/or diagnosis of SARS-CoV-2 by FDA under an Emergency Use Authorization (EUA). This EUA will remain  in effect (meaning this test can be used) for the duration of the COVID-19 declaration under Section 564(b)(1) of the Act, 21 U.S.C.section 360bbb-3(b)(1), unless the authorization is terminated  or revoked sooner.       Influenza A by PCR NEGATIVE NEGATIVE Final   Influenza B by PCR NEGATIVE NEGATIVE Final    Comment: (NOTE) The Xpert Xpress SARS-CoV-2/FLU/RSV plus assay is intended as an aid in the diagnosis of influenza from Nasopharyngeal swab specimens and should not be used as a sole basis  for treatment. Nasal washings and  aspirates are unacceptable for Xpert Xpress SARS-CoV-2/FLU/RSV testing.  Fact Sheet for Patients: EntrepreneurPulse.com.au  Fact Sheet for Healthcare Providers: IncredibleEmployment.be  This test is not yet approved or cleared by the [place] FDA and has been authorized for detection and/or diagnosis of SARS-CoV-2 by FDA under an Emergency Use Authorization (EUA). This EUA will remain in effect (meaning this test can be used) for the duration of the COVID-19 declaration under Section 564(b)(1) of the Act, 21 U.S.C. section 360bbb-3(b)(1), unless the authorization is terminated or revoked.  Performed at [facility], [address] [place], [place] [address]   MRSA Next Gen by PCR, Nasal     Status: None   Collection Time: 06/01/21  1:08 AM   Specimen: Urine, Clean Catch; Nasal Swab  Result Value Ref Range Status   MRSA by PCR Next Gen NOT DETECTED NOT DETECTED Final    Comment: (NOTE) The GeneXpert MRSA Assay (FDA approved for NASAL specimens only), is one component of a comprehensive MRSA colonization surveillance program. It is not intended to diagnose MRSA infection nor to guide or monitor treatment for MRSA infections. Test performance is not FDA approved in patients less than 50 years old. Performed at [facility], [address] [place], [place] [address]      Labs: Basic Metabolic Panel: Recent Labs  Lab 05/31/21 2045 06/01/21 0455 06/02/21 0522  NA 135 139 137  K 3.9 3.7 3.3*  CL 101 107 105  CO2 24 26 27   GLUCOSE 131* 96 123*  BUN 16 13 14   CREATININE 0.71 0.69 0.84  CALCIUM 8.8* 8.3* 8.5*  MG  --  1.9 1.9   Liver Function Tests: Recent Labs  Lab 05/31/21 2045 06/01/21 0455  AST 16 14*  ALT 19 16  ALKPHOS 53 44  BILITOT 1.1 0.8  PROT 7.2 6.3*  ALBUMIN 4.4 3.7   Recent Labs  Lab 05/31/21 2045 06/01/21 0455  WBC 13.6* 7.7  NEUTROABS 12.2* 4.8  HGB 14.3 12.8*  HCT  41.9 38.4*  MCV 85.3 87.7  PLT 262 267    Signed:  Barton Dubois MD.  [facility] 06/02/2021, 10:57 AM

## 2021-06-02 NOTE — Progress Notes (Signed)
Being discharged to home. IV access removed with incident, patient tolerated well. Discharge paperwork given to patient and found that they no longer use [facility] due to insurance requires them to use [facility] and the one patient uses is on [address] in [place]. Wife called [facility] and they are going to call [facility] and have the prescriptions transferred there. Transported by wheel chair to car and wife drove patient home.

## 2022-01-28 ENCOUNTER — Ambulatory Visit: Payer: 59 | Admitting: Podiatry

## 2022-04-03 ENCOUNTER — Other Ambulatory Visit: Payer: Self-pay | Admitting: Gastroenterology

## 2022-04-03 DIAGNOSIS — R1310 Dysphagia, unspecified: Secondary | ICD-10-CM

## 2022-04-04 ENCOUNTER — Ambulatory Visit
Admission: RE | Admit: 2022-04-04 | Discharge: 2022-04-04 | Disposition: A | Discharge status: Home / Self Care | Payer: 59 | Source: Ambulatory Visit | Attending: Gastroenterology | Admitting: Gastroenterology

## 2022-04-04 DIAGNOSIS — R1310 Dysphagia, unspecified: Secondary | ICD-10-CM

## 2022-06-17 ENCOUNTER — Ambulatory Visit (HOSPITAL_COMMUNITY)
Admission: EM | Admit: 2022-06-17 | Discharge: 2022-06-17 | Disposition: A | Discharge status: Home / Self Care | Payer: 59 | Attending: Physician Assistant | Admitting: Physician Assistant

## 2022-06-17 ENCOUNTER — Encounter (HOSPITAL_COMMUNITY): Payer: Self-pay

## 2022-06-17 DIAGNOSIS — R509 Fever, unspecified: Secondary | ICD-10-CM

## 2022-06-17 DIAGNOSIS — J101 Influenza due to other identified influenza virus with other respiratory manifestations: Secondary | ICD-10-CM

## 2022-06-17 LAB — POC INFLUENZA A AND B ANTIGEN (URGENT CARE ONLY)
INFLUENZA A ANTIGEN, POC: POSITIVE — AB
INFLUENZA B ANTIGEN, POC: NEGATIVE

## 2022-06-17 LAB — POCT RAPID STREP A, ED / UC: Streptococcus, Group A Screen (Direct): NEGATIVE

## 2022-06-17 MED ORDER — OSELTAMIVIR PHOSPHATE 75 MG PO CAPS: 75.0000 mg | ORAL_CAPSULE | Freq: Two times a day (BID) | ORAL | 0 refills | Status: DC

## 2022-06-17 MED ORDER — KETOROLAC TROMETHAMINE 30 MG/ML IJ SOLN
30.0000 mg | Freq: Once | INTRAMUSCULAR | Status: AC
  Administered 2022-06-17: 30 mg via INTRAMUSCULAR

## 2022-06-17 MED ORDER — PROMETHAZINE-DM 6.25-15 MG/5ML PO SYRP: 5.0000 mL | ORAL_SOLUTION | Freq: Three times a day (TID) | ORAL | 0 refills | Status: AC | PRN

## 2022-06-17 MED ORDER — KETOROLAC TROMETHAMINE 30 MG/ML IJ SOLN
INTRAMUSCULAR | Status: AC
  Filled 2022-06-17: qty 1

## 2022-06-17 NOTE — ED Triage Notes (Signed)
Chief Complaint: fever/bodyaches/headache/chest burning/ cough/chills. Fever of 102.8  Onset: Sunday   Prescriptions or OTC medications tried: No    Sick exposure: No  New foods, medications, or products: No  Recent Travel: No

## 2022-06-17 NOTE — Discharge Instructions (Addendum)
You tested positive for influenza A.  Start Tamiflu twice daily for 5 days.  We gave you an injection of Toradol in clinic.  Do not take NSAIDs for the next 12 hours including aspirin, ibuprofen/Advil, naproxen/Aleve.  You can use acetaminophen/Tylenol.  Use Promethazine DM for cough.  This will make you sleepy so do not drive or drink alcohol while taking it.  Use over-the-counter medications as needed for symptom relief.  Make sure you rest and drink plenty of fluid.  If your symptoms worsen or if anything changes you need to be seen immediately as we discussed.

## 2022-06-17 NOTE — ED Provider Notes (Signed)
Evan Henry    CSN: [id number] Arrival date & time: 06/17/22  1657      History   Chief Complaint Chief Complaint  Patient presents with   URI    HPI [name] is a 51 y.o. male.   Patient presents today with a 3-day history of URI symptoms including fever, body aches, headache, cough, chills, sore throat.  Denies any chest pain, shortness of breath, vomiting, diarrhea.  He has had posttussive nausea but without vomiting.  He has tried Tylenol without improvement of symptoms.  Last dose was at 5:00 PM today.  He denies history of asthma, COPD, smoking.  Denies any recent antibiotics or steroids.  He does have allergies but does not take medication for this on a regular basis.  Denies any known sick contacts but has been out shopping and supposed many people.  He has had COVID with last episode in 2023 (January).  Has not had COVID-vaccine.    Past Medical History:  Diagnosis Date   Acute bronchitis 12/21/2009   Qualifier: History of  By: Annamaria Boots MD, Clinton D    ALLERGIC RHINITIS 12/25/2009   Qualifier: Diagnosis of  By: Annamaria Boots MD, Clinton D    Cervical herniated disc 07/28/2017   Overview:  [facility] - Dr. 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 Earlean Shawl - q 5 years colonoscopy. Last 2013/cla   Varicocele 04/19/2013   Overview:  Dr. Karsten Ro    Patient Active Problem List   Diagnosis Date Noted   Hypokalemia    Atrial fibrillation with RVR ([place])  05/31/2021   Hypertension 06/16/2019   Fasciculations 06/01/2019   Myalgia 06/01/2019   OSA on CPAP 06/01/2019   Rib pain on left side 06/01/2019   Paresthesia 07/28/2018   Memory loss 10/08/2017   Depression with anxiety 10/08/2017   Snoring 10/08/2017   Excessive daytime sleepiness 10/08/2017   Cervical herniated disc 07/28/2017   Leg length discrepancy 07/28/2017   Varicocele 04/19/2013   Family history of premature CAD 12/15/2012   GERD (gastroesophageal reflux disease) 08/17/2012   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 Authorizing Provider  diltiazem (CARDIZEM CD) 120 MG 24 hr capsule Take 1 capsule (120 mg total) by mouth daily. 06/03/21  Yes Barton Dubois, MD  metoprolol tartrate (LOPRESSOR) 25 MG tablet Take 1 tablet (25 mg total) by mouth 2 (two) times daily. 06/02/21  Yes Barton Dubois, MD  oseltamivir (TAMIFLU) 75 MG capsule Take 1 capsule (75 mg total) by mouth every 12 (twelve) hours. 06/17/22  Yes Dalyce Renne, Derry Skill, PA-C  promethazine-dextromethorphan (PROMETHAZINE-DM) 6.25-15 MG/5ML syrup Take 5 mLs by mouth 3 (three) times daily as needed for cough. 06/17/22  Yes Horrace Hanak K, PA-C  pantoprazole (PROTONIX) 40 MG tablet Take 1 tablet (40 mg total) by mouth daily. 06/02/21 06/02/22  Barton Dubois, MD    Family History Family History  Problem Relation Age of Onset   Emphysema Mother    Heart attack Father 21   Heart attack Brother 64   Stroke Brother     Social History Social History   Tobacco Use   Smoking status: Never   Smokeless tobacco:  Never  Substance Use Topics   Alcohol use: Yes    Comment: occ   Drug use: No     Allergies   Ciprofloxacin, Levofloxacin, Meloxicam, and Sulfa antibiotics   Review of Systems Review of Systems  Constitutional:  Positive for activity change, fatigue and fever. Negative for appetite change.  HENT:  Positive for congestion, sinus pressure and sore throat. Negative for sneezing.   Respiratory:  Positive for cough. Negative for shortness of breath.   Cardiovascular:  Negative for chest pain.  Gastrointestinal:  Positive for nausea. Negative for abdominal pain, diarrhea and vomiting.  Musculoskeletal:  Positive for arthralgias, back pain and myalgias.  Neurological:  Positive for headaches. Negative for dizziness and light-headedness.     Physical Exam Triage Vital Signs ED Triage Vitals  Enc Vitals Group     BP 06/17/22 2107 (!) 146/86     Pulse Rate 06/17/22 2107 (!) 106     Resp 06/17/22 2107 16     Temp 06/17/22 2107 (!) 100.4 F (38 C)     Temp Source 06/17/22 2107 Oral     SpO2 06/17/22 2107 95 %     Weight --      Height --      Head Circumference --      Peak Flow --      Pain Score 06/17/22 2106 5     Pain Loc --      Pain Edu? --      Excl. in [place]? --    No data found.  Updated Vital Signs BP 128/81 (BP Location: Right Arm)   Pulse (!) 110   Temp (!) 100.5 F (38.1 C) (Oral)   Resp 18   SpO2 96%   Visual Acuity Right Eye Distance:   Left Eye Distance:   Bilateral Distance:    Right Eye Near:   Left Eye Near:    Bilateral Near:     Physical Exam Vitals reviewed.  Constitutional:      General: He is awake.     Appearance: Normal appearance. He is well-developed. He is not ill-appearing.     Comments: Very pleasant male appears stated age in no acute distress sitting comfortably in exam room  HENT:     Head: Normocephalic and atraumatic.     Right Ear: Tympanic membrane, ear canal and external ear normal. Tympanic membrane is not erythematous or  bulging.     Left Ear: Tympanic membrane, ear canal and external ear normal. Tympanic membrane is not erythematous or bulging.     Nose: Nose normal.     Mouth/Throat:     Pharynx: Uvula midline. Posterior oropharyngeal erythema present. No oropharyngeal exudate.     Tonsils: No tonsillar exudate.  Cardiovascular:     Rate and Rhythm: Normal rate and regular rhythm.     Heart sounds: Normal heart sounds, S1 normal and S2 normal. No  murmur heard. Pulmonary:     Effort: Pulmonary effort is normal. No accessory muscle usage or respiratory distress.     Breath sounds: Normal breath sounds. No stridor. No wheezing, rhonchi or rales.     Comments: Clear to auscultation bilaterally Abdominal:     General: Bowel sounds are normal.     Palpations: Abdomen is soft.     Tenderness: There is no abdominal tenderness.  Neurological:     Mental Status: He is alert.  Psychiatric:        Behavior: Behavior is cooperative.      UC Treatments / Results  Labs (all labs ordered are listed, but only abnormal results are displayed) Labs Reviewed  POC INFLUENZA A AND B ANTIGEN (URGENT CARE ONLY) - Abnormal; Notable for the following components:      Result Value   INFLUENZA A ANTIGEN, POC POSITIVE (*)    All other components within normal limits  POCT RAPID STREP A, ED / UC    EKG   Radiology No results found.  Procedures Procedures (including critical care time)  Medications Ordered in UC Medications  ketorolac (TORADOL) 30 MG/ML injection 30 mg (30 mg Intramuscular Given 06/17/22 2136)    Initial Impression / Assessment and Plan / UC Course  I have reviewed the triage vital signs and the nursing notes.  Pertinent labs & imaging results that were available during my care of the patient were reviewed by me and considered in my medical decision making (see chart for details).     Patient is febrile and tachycardic likely related to viral illness.  He is otherwise well-appearing in no  acute distress.  He had recently taken Tylenol so was given an injection of Toradol in clinic today.  Discussed that he is to avoid NSAIDs for 12 hours after injection.  He can use acetaminophen/Tylenol.  He tested positive for influenza A.  Will start Tamiflu 75 mg twice daily for 5 days.  He requested a strep test given his sore throat which was negative.  He was given Promethazine DM for cough with instruction history of drink alcohol with taking this medication.  He is to rest and drink plenty of fluid.  Discussed that if his symptoms are improving by next week he needs to return for reevaluation.  If he has any worsening symptoms including chest pain, shortness of breath, nausea/vomiting interfrontal intake, weakness he needs to be seen immediately.  Strict return precautions given.  Work excuse note provided.  Final Clinical Impressions(s) / UC Diagnoses   Final diagnoses:  Influenza A  Fever, unspecified     Discharge Instructions      You tested positive for influenza A.  Start Tamiflu twice daily for 5 days.  We gave you an injection of Toradol in clinic.  Do not take NSAIDs for the next 12 hours including aspirin, ibuprofen/Advil, naproxen/Aleve.  You can use acetaminophen/Tylenol.  Use Promethazine DM for cough.  This will make you sleepy so do not drive or drink alcohol while taking it.  Use over-the-counter medications as needed for symptom relief.  Make sure you rest and drink plenty of fluid.  If your symptoms worsen or if anything changes you need to be seen immediately as we discussed.     ED Prescriptions     Medication Sig Dispense Auth. Provider   promethazine-dextromethorphan (PROMETHAZINE-DM) 6.25-15 MG/5ML syrup Take 5 mLs by mouth 3 (three) times daily as needed for cough. 118 mL Keola Heninger K, PA-C  oseltamivir (TAMIFLU) 75 MG capsule Take 1 capsule (75 mg total) by mouth every 12 (twelve) hours. 10 capsule Destenee Guerry, Derry Skill, PA-C      PDMP not reviewed this  encounter.   Terrilee Croak, PA-C 06/17/22 2202

## 2023-09-24 NOTE — Progress Notes (Unsigned)
  Electrophysiology Office Note:    Date:  09/25/2023   ID:  Evan Henry, DOB 1970/10/29, MRN [id number]  [facility] Cardiologist:  None  [facility] Electrophysiologist:  Evan Prude, MD   Referring MD: Evan Davenport, MD   Chief Complaint: Atrial fibrillation, establish care  History of Present Illness:    History of Evan Henry is a 53 year old man who I am seeing today for evaluation of atrial fibrillation at the request of Dr. Hal Henry.  The patient [facility]  He has a history of hypertension, migraines and paroxysmal atrial fibrillation.  According to a recent note with Dr. Clarene Henry, the patient is on diltiazem and metoprolol.  He is doing well today.  He reports his original episode of atrial fibrillation was in the setting of nausea and vomiting.  He has not had a recurrence that he is aware of.  He takes diltiazem and metoprolol.  He tells me that if he is late taking his diltiazem he can experience a headache and some skipped heartbeats.    He is with his wife today in clinic.  They live in the [place] [facility] area.     Their past medical, social and family history was reviewed.   ROS:   Please see the history of present illness.    All other systems reviewed and are negative.  EKGs/Labs/Other Studies Reviewed:    The following studies were reviewed today:  A ZIO monitor was reviewed from 2022 in the [facility] records.  No atrial fibrillation during the monitoring period.       Physical Exam:    VS:  BP 110/78   Pulse 73   Ht 5\' 9"  (1.753 m)   Wt 196 lb (88.9 kg)   SpO2 97%   BMI 28.94 kg/m     Wt Readings from Last 3 Encounters:  09/25/23 196 lb (88.9 kg)  06/02/21 195 lb 12.3 oz (88.8 kg)  10/13/19 190 lb (86.2 kg)     GEN: no distress CARD: RRR, No MRG RESP: No IWOB. CTAB.        ASSESSMENT AND PLAN:    1. Paroxysmal atrial fibrillation (HCC)   2. Primary hypertension     #Paroxysmal  atrial fibrillation CHA2DS2-VASc of 1 for hypertension Low burden historically.  I suspect his atrial fibrillation was vagally mediated.  #Hypertension At goal today.  Recommend checking blood pressures 1-2 times per week at home and recording the values.  Recommend bringing these recordings to the primary care physician.   Follow-up 1 year with APP       Signed, Sheria Lang T. Lalla Brothers, MD, [facility], [facility] 09/25/2023 2:10 PM    Electrophysiology [facility]

## 2023-09-25 ENCOUNTER — Encounter: Payer: Self-pay | Admitting: Cardiology

## 2023-09-25 ENCOUNTER — Ambulatory Visit: Payer: BC Managed Care – PPO | Attending: Internal Medicine | Admitting: Cardiology

## 2023-09-25 VITALS — BP 110/78 | HR 73 | Ht 69.0 in | Wt 196.0 lb

## 2023-09-25 DIAGNOSIS — I480 Paroxysmal atrial fibrillation: Secondary | ICD-10-CM | POA: Diagnosis not present

## 2023-09-25 DIAGNOSIS — I10 Essential (primary) hypertension: Secondary | ICD-10-CM | POA: Diagnosis not present

## 2023-09-25 MED ORDER — DILTIAZEM HCL ER COATED BEADS 120 MG PO CP24: 120.0000 mg | ORAL_CAPSULE | Freq: Every day | ORAL | 3 refills | Status: AC

## 2023-09-25 MED ORDER — METOPROLOL TARTRATE 25 MG PO TABS: 25.0000 mg | ORAL_TABLET | Freq: Two times a day (BID) | ORAL | 2 refills | Status: DC

## 2023-09-25 NOTE — Patient Instructions (Signed)
 Medication Instructions:  Your physician recommends that you continue on your current medications as directed. Please refer to the Current Medication list given to you today.  *If you need a refill on your cardiac medications before your next appointment, please call your pharmacy*  Follow-Up: At [facility], you and your health needs are our priority.  As part of our continuing mission to provide you with exceptional heart care, our providers are all part of one team.  This team includes your primary Cardiologist (physician) and Advanced Practice Providers or APPs (Physician Assistants and Nurse Practitioners) who all work together to provide you with the care you need, when you need it.  Your next appointment:   1 year  Provider:   You will see one of the following Advanced Practice Providers on your designated Care Team:   Francis Dowse, [place] Casimiro Needle "Mardelle Matte" Wagoner, PA-C Sherie Don, NP Canary Brim, NP       1st Floor: - Lobby - Registration  - Pharmacy  - Lab - Cafe  2nd Floor: - PV Lab - Diagnostic Testing (echo, CT, nuclear med)  3rd Floor: - Vacant  4th Floor: - TCTS (cardiothoracic surgery) - AFib Clinic - Structural Heart Clinic - Vascular Surgery  - Vascular Ultrasound  5th Floor: - HeartCare Cardiology (general and EP) - Clinical Pharmacy for coumadin, hypertension, lipid, weight-loss medications, and med management appointments    Valet parking services will be available as well.

## 2024-06-17 ENCOUNTER — Other Ambulatory Visit: Payer: Self-pay

## 2024-06-17 DIAGNOSIS — R1031 Right lower quadrant pain: Secondary | ICD-10-CM

## 2024-06-20 ENCOUNTER — Other Ambulatory Visit (HOSPITAL_COMMUNITY): Payer: Self-pay

## 2024-06-20 DIAGNOSIS — R1031 Right lower quadrant pain: Secondary | ICD-10-CM

## 2024-06-24 ENCOUNTER — Other Ambulatory Visit

## 2024-06-25 ENCOUNTER — Ambulatory Visit (HOSPITAL_BASED_OUTPATIENT_CLINIC_OR_DEPARTMENT_OTHER)
Admission: RE | Admit: 2024-06-25 | Discharge: 2024-06-25 | Disposition: A | Discharge status: Home / Self Care | Source: Ambulatory Visit

## 2024-06-25 DIAGNOSIS — R1031 Right lower quadrant pain: Secondary | ICD-10-CM | POA: Diagnosis present

## 2024-06-25 MED ORDER — IOHEXOL 300 MG/ML  SOLN
100.0000 mL | Freq: Once | INTRAMUSCULAR | Status: AC | PRN
  Administered 2024-06-25: 100 mL via INTRAVENOUS

## 2024-06-28 ENCOUNTER — Other Ambulatory Visit: Payer: Self-pay | Admitting: Cardiology

## 2024-06-28 LAB — POCT I-STAT CREATININE: Creatinine, Ser: 1 mg/dL (ref 0.61–1.24)

## 2024-06-28 MED ORDER — METOPROLOL TARTRATE 25 MG PO TABS: 25.0000 mg | ORAL_TABLET | Freq: Two times a day (BID) | ORAL | 2 refills | Status: AC
